# Patient Record
Sex: Female | Born: 1950 | Race: White | Hispanic: No | Marital: Married | Smoking: Never smoker
Health system: Southern US, Community
[De-identification: ages and names within clinical notes are randomized; demographics above are authoritative.]

## PROBLEM LIST (undated history)

## (undated) DIAGNOSIS — Z8719 Personal history of other diseases of the digestive system: Secondary | ICD-10-CM

## (undated) DIAGNOSIS — E669 Obesity, unspecified: Secondary | ICD-10-CM

## (undated) DIAGNOSIS — K5190 Ulcerative colitis, unspecified, without complications: Secondary | ICD-10-CM

## (undated) DIAGNOSIS — M1990 Unspecified osteoarthritis, unspecified site: Secondary | ICD-10-CM

## (undated) DIAGNOSIS — K219 Gastro-esophageal reflux disease without esophagitis: Secondary | ICD-10-CM

## (undated) DIAGNOSIS — D3500 Benign neoplasm of unspecified adrenal gland: Secondary | ICD-10-CM

## (undated) DIAGNOSIS — E785 Hyperlipidemia, unspecified: Secondary | ICD-10-CM

## (undated) DIAGNOSIS — K2970 Gastritis, unspecified, without bleeding: Secondary | ICD-10-CM

## (undated) DIAGNOSIS — M8580 Other specified disorders of bone density and structure, unspecified site: Secondary | ICD-10-CM

## (undated) DIAGNOSIS — Z8489 Family history of other specified conditions: Secondary | ICD-10-CM

## (undated) DIAGNOSIS — L570 Actinic keratosis: Secondary | ICD-10-CM

## (undated) DIAGNOSIS — I10 Essential (primary) hypertension: Secondary | ICD-10-CM

## (undated) DIAGNOSIS — R12 Heartburn: Secondary | ICD-10-CM

## (undated) DIAGNOSIS — R519 Headache, unspecified: Secondary | ICD-10-CM

## (undated) HISTORY — PX: ABDOMINAL HYSTERECTOMY: SHX81

## (undated) HISTORY — PX: CHOLECYSTECTOMY: SHX55

## (undated) HISTORY — DX: Obesity, unspecified: E66.9

## (undated) HISTORY — DX: Ulcerative colitis, unspecified, without complications: K51.90

## (undated) HISTORY — PX: GALLBLADDER SURGERY: SHX652

## (undated) HISTORY — PX: OTHER SURGICAL HISTORY: SHX169

## (undated) HISTORY — PX: LEG SURGERY: SHX1003

## (undated) HISTORY — PX: FRACTURE SURGERY: SHX138

## (undated) HISTORY — PX: EYE SURGERY: SHX253

## (undated) HISTORY — DX: Actinic keratosis: L57.0

## (undated) HISTORY — DX: Other specified disorders of bone density and structure, unspecified site: M85.80

## (undated) HISTORY — DX: Hyperlipidemia, unspecified: E78.5

## (undated) HISTORY — PX: COLONOSCOPY: SHX174

---

## 1998-05-31 ENCOUNTER — Other Ambulatory Visit: Admission: RE | Admit: 1998-05-31 | Discharge: 1998-05-31 | Payer: Self-pay | Admitting: Podiatry

## 2001-06-02 ENCOUNTER — Encounter: Payer: Self-pay | Admitting: Unknown Physician Specialty

## 2001-06-02 ENCOUNTER — Encounter: Admission: RE | Admit: 2001-06-02 | Discharge: 2001-06-02 | Payer: Self-pay | Admitting: Unknown Physician Specialty

## 2002-06-08 ENCOUNTER — Encounter: Admission: RE | Admit: 2002-06-08 | Discharge: 2002-06-08 | Payer: Self-pay | Admitting: Unknown Physician Specialty

## 2002-06-08 ENCOUNTER — Encounter: Payer: Self-pay | Admitting: Unknown Physician Specialty

## 2003-08-01 ENCOUNTER — Encounter: Admission: RE | Admit: 2003-08-01 | Discharge: 2003-08-01 | Payer: Self-pay | Admitting: Unknown Physician Specialty

## 2004-08-02 ENCOUNTER — Encounter: Admission: RE | Admit: 2004-08-02 | Discharge: 2004-08-02 | Payer: Self-pay | Admitting: Unknown Physician Specialty

## 2005-05-28 ENCOUNTER — Ambulatory Visit: Payer: Self-pay | Admitting: Unknown Physician Specialty

## 2005-07-08 ENCOUNTER — Ambulatory Visit: Payer: Self-pay | Admitting: Unknown Physician Specialty

## 2005-08-05 ENCOUNTER — Encounter: Admission: RE | Admit: 2005-08-05 | Discharge: 2005-08-05 | Payer: Self-pay | Admitting: Unknown Physician Specialty

## 2005-09-14 ENCOUNTER — Emergency Department: Payer: Self-pay | Admitting: Emergency Medicine

## 2006-07-01 ENCOUNTER — Emergency Department (HOSPITAL_COMMUNITY): Admission: EM | Admit: 2006-07-01 | Discharge: 2006-07-01 | Payer: Self-pay | Admitting: Family Medicine

## 2006-07-12 ENCOUNTER — Ambulatory Visit: Payer: Self-pay | Admitting: Unknown Physician Specialty

## 2006-08-08 ENCOUNTER — Encounter: Admission: RE | Admit: 2006-08-08 | Discharge: 2006-08-08 | Payer: Self-pay | Admitting: Unknown Physician Specialty

## 2006-08-18 ENCOUNTER — Encounter: Admission: RE | Admit: 2006-08-18 | Discharge: 2006-08-18 | Payer: Self-pay | Admitting: Unknown Physician Specialty

## 2006-09-15 ENCOUNTER — Ambulatory Visit: Payer: Self-pay | Admitting: Unknown Physician Specialty

## 2007-07-27 ENCOUNTER — Ambulatory Visit: Payer: Self-pay | Admitting: Obstetrics and Gynecology

## 2007-08-03 ENCOUNTER — Ambulatory Visit: Payer: Self-pay | Admitting: Obstetrics and Gynecology

## 2007-09-01 ENCOUNTER — Encounter: Admission: RE | Admit: 2007-09-01 | Discharge: 2007-09-01 | Payer: Self-pay | Admitting: Unknown Physician Specialty

## 2007-11-24 ENCOUNTER — Ambulatory Visit: Payer: Self-pay | Admitting: Unknown Physician Specialty

## 2007-12-29 ENCOUNTER — Ambulatory Visit: Payer: Self-pay | Admitting: Unknown Physician Specialty

## 2008-09-01 ENCOUNTER — Encounter: Admission: RE | Admit: 2008-09-01 | Discharge: 2008-09-01 | Payer: Self-pay | Admitting: Unknown Physician Specialty

## 2010-01-10 ENCOUNTER — Ambulatory Visit: Payer: Self-pay | Admitting: Unknown Physician Specialty

## 2010-03-18 ENCOUNTER — Encounter: Payer: Self-pay | Admitting: Unknown Physician Specialty

## 2010-11-26 ENCOUNTER — Other Ambulatory Visit: Payer: Self-pay | Admitting: Unknown Physician Specialty

## 2010-11-29 ENCOUNTER — Other Ambulatory Visit: Payer: Self-pay | Admitting: Unknown Physician Specialty

## 2011-04-23 ENCOUNTER — Ambulatory Visit: Payer: Self-pay

## 2011-11-05 ENCOUNTER — Other Ambulatory Visit: Payer: Self-pay

## 2012-07-01 ENCOUNTER — Ambulatory Visit: Payer: Self-pay | Admitting: Unknown Physician Specialty

## 2012-07-23 ENCOUNTER — Ambulatory Visit: Payer: Self-pay | Admitting: Unknown Physician Specialty

## 2012-08-06 ENCOUNTER — Ambulatory Visit: Payer: Self-pay | Admitting: Unknown Physician Specialty

## 2013-07-02 ENCOUNTER — Ambulatory Visit: Payer: Self-pay | Admitting: Physician Assistant

## 2013-10-07 ENCOUNTER — Ambulatory Visit: Payer: Self-pay

## 2013-10-07 ENCOUNTER — Encounter: Payer: Self-pay | Admitting: Podiatry

## 2013-10-07 ENCOUNTER — Ambulatory Visit (INDEPENDENT_AMBULATORY_CARE_PROVIDER_SITE_OTHER): Payer: Medicare Other | Admitting: Podiatry

## 2013-10-07 VITALS — BP 133/85 | HR 68 | Resp 16 | Ht 63.0 in | Wt 193.0 lb

## 2013-10-07 DIAGNOSIS — K5190 Ulcerative colitis, unspecified, without complications: Secondary | ICD-10-CM | POA: Insufficient documentation

## 2013-10-07 DIAGNOSIS — M8580 Other specified disorders of bone density and structure, unspecified site: Secondary | ICD-10-CM | POA: Insufficient documentation

## 2013-10-07 DIAGNOSIS — M722 Plantar fascial fibromatosis: Secondary | ICD-10-CM

## 2013-10-07 DIAGNOSIS — E785 Hyperlipidemia, unspecified: Secondary | ICD-10-CM | POA: Insufficient documentation

## 2013-10-07 DIAGNOSIS — D3500 Benign neoplasm of unspecified adrenal gland: Secondary | ICD-10-CM | POA: Insufficient documentation

## 2013-10-07 DIAGNOSIS — E669 Obesity, unspecified: Secondary | ICD-10-CM | POA: Insufficient documentation

## 2013-10-07 DIAGNOSIS — K2970 Gastritis, unspecified, without bleeding: Secondary | ICD-10-CM | POA: Insufficient documentation

## 2013-10-07 DIAGNOSIS — E7800 Pure hypercholesterolemia, unspecified: Secondary | ICD-10-CM | POA: Insufficient documentation

## 2013-10-07 MED ORDER — METHYLPREDNISOLONE (PAK) 4 MG PO TABS: ORAL_TABLET | ORAL | Status: DC

## 2013-10-07 NOTE — Progress Notes (Signed)
   Subjective:    Patient ID: Dineen Kid, female    DOB: June 19, 1950, 63 y.o.   MRN: [id number]  HPI Comments: Easter Sunday the heel started to hurt. i drove home barefooted. During that week my heel started hurting. It feels like needles shooting through it. I  Have tried to wear orthotics, compression stocking it stays swollen. It is not getting any better, used frozen water bottle and heat and it made it feel bad. Tried to use some exercises .  Foot Pain      Review of Systems  Musculoskeletal: Positive for back pain.       Muscle pain  Joint pain   Hematological: Bruises/bleeds easily.  All other systems reviewed and are negative.      Objective:   Physical Exam: I have reviewed her past medical history medications allergies surgeries social history and review of systems. Pulses are palpable bilateral. Neurologic sensorium is intact per Semmes-Weinstein monofilament. Deep tendon reflexes are intact bilateral muscle strength is 5 over 5 dorsiflexors plantar flexors inverters everters all intrinsic musculature is intact. Orthopedic evaluation does demonstrate pain on palpation medial continued tubercle of the right heel. Radiographic evaluation does demonstrate soft tissue increase in density at the plantar fascial calcaneal insertion site of the right heel. Cutaneous evaluation demonstrates supple well hydrated cutis no erythema edema saline is drainage or odor.  Assessment: Plantar fasciitis right.  Plan: Discussed etiology pathology conservative versus surgical therapies. Secondary to her colitis we'll he prescribed a Medrol Dosepak. Injected her right heel with Kenalog and local anesthetic. Put her in a plantar fascial brace and dispensed a night splint. Discussed appropriate shoe gear stretching exercises ice therapy and shoe gear modifications. I will followup with her in one month.        Assessment & Plan:

## 2013-10-07 NOTE — Patient Instructions (Signed)

## 2013-11-04 ENCOUNTER — Ambulatory Visit (INDEPENDENT_AMBULATORY_CARE_PROVIDER_SITE_OTHER): Payer: Medicare Other | Admitting: Podiatry

## 2013-11-04 ENCOUNTER — Encounter: Payer: Self-pay | Admitting: Podiatry

## 2013-11-04 VITALS — BP 122/72 | HR 74 | Resp 16

## 2013-11-04 DIAGNOSIS — M722 Plantar fascial fibromatosis: Secondary | ICD-10-CM

## 2013-11-04 NOTE — Progress Notes (Signed)
She presents today for followup of her plantar fasciitis. She states that she's approximately 95% improved after purchasing a new pair of new balance tennis shoes. She states that she was unable to wear the night splint but really enjoys the plantar fascial brace. She continues medication and other conservative therapies.  Objective: No pain on palpation medial calcaneal tubercle of the left heel. Pulses remain palpable no calf pain.  Assessment: Well-healing plantar fasciitis left.  Plan: Continue all conservative therapies and tissues 100% improved then continue that for one month. Followup with her on an as-needed basis.

## 2014-06-21 ENCOUNTER — Other Ambulatory Visit: Payer: Self-pay | Admitting: Physician Assistant

## 2014-06-21 DIAGNOSIS — Z1231 Encounter for screening mammogram for malignant neoplasm of breast: Secondary | ICD-10-CM

## 2014-07-06 ENCOUNTER — Ambulatory Visit: Payer: Self-pay

## 2014-07-11 ENCOUNTER — Emergency Department: Payer: PPO

## 2014-07-11 ENCOUNTER — Emergency Department
Admission: EM | Admit: 2014-07-11 | Discharge: 2014-07-11 | Disposition: A | Discharge status: Home / Self Care | Payer: PPO | Attending: Emergency Medicine | Admitting: Emergency Medicine

## 2014-07-11 ENCOUNTER — Encounter: Payer: Self-pay | Admitting: Occupational Medicine

## 2014-07-11 DIAGNOSIS — Z91040 Latex allergy status: Secondary | ICD-10-CM | POA: Insufficient documentation

## 2014-07-11 DIAGNOSIS — Y998 Other external cause status: Secondary | ICD-10-CM | POA: Insufficient documentation

## 2014-07-11 DIAGNOSIS — S99911A Unspecified injury of right ankle, initial encounter: Secondary | ICD-10-CM | POA: Diagnosis present

## 2014-07-11 DIAGNOSIS — W1830XA Fall on same level, unspecified, initial encounter: Secondary | ICD-10-CM | POA: Diagnosis not present

## 2014-07-11 DIAGNOSIS — Y9289 Other specified places as the place of occurrence of the external cause: Secondary | ICD-10-CM | POA: Insufficient documentation

## 2014-07-11 DIAGNOSIS — Y9389 Activity, other specified: Secondary | ICD-10-CM | POA: Diagnosis not present

## 2014-07-11 DIAGNOSIS — Z7983 Long term (current) use of bisphosphonates: Secondary | ICD-10-CM | POA: Insufficient documentation

## 2014-07-11 DIAGNOSIS — S93401A Sprain of unspecified ligament of right ankle, initial encounter: Secondary | ICD-10-CM | POA: Diagnosis not present

## 2014-07-11 MED ORDER — TRAMADOL HCL 50 MG PO TABS
ORAL_TABLET | ORAL | Status: AC
  Administered 2014-07-11: 100 mg via ORAL
  Filled 2014-07-11: qty 2

## 2014-07-11 MED ORDER — TRAMADOL HCL 50 MG PO TABS
50.0000 mg | ORAL_TABLET | Freq: Four times a day (QID) | ORAL | Status: AC | PRN
Start: 2014-07-11 — End: 2015-07-11

## 2014-07-11 MED ORDER — TRAMADOL HCL 50 MG PO TABS
100.0000 mg | ORAL_TABLET | Freq: Once | ORAL | Status: AC
  Administered 2014-07-11: 100 mg via ORAL

## 2014-07-11 NOTE — ED Provider Notes (Signed)
[facility] Emergency Department Provider Note  Time seen: 7:56 PM  I have reviewed the triage vital signs and the nursing notes.   HISTORY  Chief Complaint Foot Pain    HPI Jamie Reid is a 64 y.o. female with a past medical history of ulcerative colitis, hyperlipidemia, reflux who presents the emergency department with right ankle pain. According to the patient she tripped and rolled her ankle. Patient has been able to walk on it but states increasing pain and swelling to she came to the ER for evaluation. Denies any other injuries, denies hitting her head or loss of consciousness. Patient describes the pain as dull and constant moderate in severity and worse with ambulation or trying to bear weight.     Past Medical History  Diagnosis Date  . Ulcerative colitis   . Hyperlipidemia   . Osteopenia   . Adiposity   . Colitis gravis     Patient Active Problem List   Diagnosis Date Noted  . Adrenal adenoma 10/07/2013  . Gastric catarrh 10/07/2013  . Hypercholesteremia 10/07/2013  . HLD (hyperlipidemia) 10/07/2013  . Adiposity 10/07/2013  . Osteopenia 10/07/2013  . Colitis gravis 10/07/2013    Past Surgical History  Procedure Laterality Date  . Abdominal hysterectomy    . Gallbladder surgery      Current Outpatient Rx  Name  Route  Sig  Dispense  Refill  . alendronate (FOSAMAX) 35 MG tablet   Oral   Take by mouth.         . Cholecalciferol (VITAMIN D3) 1000 UNITS CAPS   Oral   Take by mouth.         . Mesalamine (DELZICOL) 400 MG CPDR DR capsule      Take three tablets by mouth three times a day         . pantoprazole (PROTONIX) 40 MG tablet   Oral   Take by mouth.           Allergies Calcium; Codeine; Latex; Nsaids; and Sulfa antibiotics  Family History  Problem Relation Age of Onset  . Heart attack Mother   . Heart attack Father   . Cancer Sister     Social History History  Substance Use Topics  . Smoking  status: Never Smoker   . Smokeless tobacco: Never Used  . Alcohol Use: Not on file    Review of Systems Constitutional: Negative for LOC Cardiovascular: Negative for chest pain. Gastrointestinal: Negative for abdominal pain Musculoskeletal: Negative for back pain. Positive for right ankle pain  10-point ROS otherwise negative.  ____________________________________________   PHYSICAL EXAM:  VITAL SIGNS: ED Triage Vitals  Enc Vitals Group     BP 07/11/14 1947 147/78 mmHg     Pulse Rate 07/11/14 1947 68     Resp --      Temp 07/11/14 1947 97.9 F (36.6 C)     Temp Source 07/11/14 1947 Oral     SpO2 07/11/14 1947 98 %     Weight --      Height --      Head Cir --      Peak Flow --      Pain Score 07/11/14 1947 8     Pain Loc --      Pain Edu? --      Excl. in [place]? --     Constitutional: Alert and oriented. Well appearing and in no distress. Cardiovascular: Normal rate, regular rhythm Respiratory: Normal respiratory effort without tachypnea  nor retractions. Breath sounds are clear  Gastrointestinal: Soft and nontender. Musculoskeletal: Moderate tenderness palpation of the right ankle and proximal right foot mild swelling, no ecchymosis noted. Good cap refill in all digits distally. Normal sensation. Good range of motion but with pain. Neurologic:  Normal speech and language. No gross focal neurologic deficits  Skin:  Skin is warm, dry and intact.  Psychiatric: Mood and affect are normal. Speech and behavior are normal  ____________________________________________   RADIOLOGY  Ankle and foot x-rays are negative.  ____________________________________________   INITIAL IMPRESSION / ASSESSMENT AND PLAN / ED COURSE  Pertinent labs & imaging results that were available during my care of the patient were reviewed by me and considered in my medical decision making (see chart for details).  Right ankle pain status post roll injury. We'll check an x-ray. No other  injuries identified on exam.  ----------------------------------------- 9:31 PM on 07/11/2014 -----------------------------------------  X-rays are negative, most consistent with significant ankle sprain. We'll place in an air splint, patient has crutches with her and will place on Ultram with orthopedic follow-up if not better in one week.  ____________________________________________   FINAL CLINICAL IMPRESSION(S) / ED DIAGNOSES  Right ankle sprain   Harvest Dark, MD 07/11/14 2131

## 2014-07-11 NOTE — ED Notes (Signed)
Pt was in the garden got tangled up in the jupitor and fell heard pop to right foot. Right top of foot is painful to touch and movement swelling noted. Pain 8/10. Pt came in barely putting weight to right foot with crutches. Pt denies hitting head or having LOC. Pt has a skin tear to left hand covered with Band-Aid bleeding under control.

## 2014-07-11 NOTE — Discharge Instructions (Signed)

## 2014-07-13 ENCOUNTER — Other Ambulatory Visit: Payer: Self-pay | Admitting: Physician Assistant

## 2014-07-13 ENCOUNTER — Ambulatory Visit
Admission: RE | Admit: 2014-07-13 | Discharge: 2014-07-13 | Disposition: A | Discharge status: Home / Self Care | Payer: PPO | Source: Ambulatory Visit | Attending: Physician Assistant | Admitting: Physician Assistant

## 2014-07-13 DIAGNOSIS — Z1231 Encounter for screening mammogram for malignant neoplasm of breast: Secondary | ICD-10-CM | POA: Diagnosis not present

## 2014-08-31 ENCOUNTER — Other Ambulatory Visit: Payer: Self-pay | Admitting: Physician Assistant

## 2014-08-31 DIAGNOSIS — D3502 Benign neoplasm of left adrenal gland: Secondary | ICD-10-CM

## 2014-09-05 ENCOUNTER — Ambulatory Visit
Admission: RE | Admit: 2014-09-05 | Discharge: 2014-09-05 | Disposition: A | Discharge status: Home / Self Care | Payer: PPO | Source: Ambulatory Visit | Attending: Physician Assistant | Admitting: Physician Assistant

## 2014-09-05 DIAGNOSIS — E785 Hyperlipidemia, unspecified: Secondary | ICD-10-CM | POA: Diagnosis not present

## 2014-09-05 DIAGNOSIS — K838 Other specified diseases of biliary tract: Secondary | ICD-10-CM | POA: Diagnosis not present

## 2014-09-05 DIAGNOSIS — Z90710 Acquired absence of both cervix and uterus: Secondary | ICD-10-CM | POA: Diagnosis not present

## 2014-09-05 DIAGNOSIS — I700 Atherosclerosis of aorta: Secondary | ICD-10-CM | POA: Insufficient documentation

## 2014-09-05 DIAGNOSIS — K449 Diaphragmatic hernia without obstruction or gangrene: Secondary | ICD-10-CM | POA: Diagnosis not present

## 2014-09-05 DIAGNOSIS — Z9049 Acquired absence of other specified parts of digestive tract: Secondary | ICD-10-CM | POA: Insufficient documentation

## 2014-09-05 DIAGNOSIS — D3502 Benign neoplasm of left adrenal gland: Secondary | ICD-10-CM

## 2014-09-30 ENCOUNTER — Encounter: Payer: Self-pay | Admitting: Emergency Medicine

## 2014-09-30 ENCOUNTER — Emergency Department
Admission: EM | Admit: 2014-09-30 | Discharge: 2014-09-30 | Disposition: A | Discharge status: Home / Self Care | Payer: PPO | Attending: Emergency Medicine | Admitting: Emergency Medicine

## 2014-09-30 ENCOUNTER — Emergency Department: Payer: PPO

## 2014-09-30 DIAGNOSIS — Z792 Long term (current) use of antibiotics: Secondary | ICD-10-CM | POA: Diagnosis not present

## 2014-09-30 DIAGNOSIS — R1013 Epigastric pain: Secondary | ICD-10-CM | POA: Insufficient documentation

## 2014-09-30 DIAGNOSIS — F419 Anxiety disorder, unspecified: Secondary | ICD-10-CM | POA: Insufficient documentation

## 2014-09-30 DIAGNOSIS — M542 Cervicalgia: Secondary | ICD-10-CM | POA: Diagnosis not present

## 2014-09-30 DIAGNOSIS — Z91040 Latex allergy status: Secondary | ICD-10-CM | POA: Diagnosis not present

## 2014-09-30 DIAGNOSIS — Z79899 Other long term (current) drug therapy: Secondary | ICD-10-CM | POA: Insufficient documentation

## 2014-09-30 DIAGNOSIS — I10 Essential (primary) hypertension: Secondary | ICD-10-CM | POA: Diagnosis not present

## 2014-09-30 DIAGNOSIS — R61 Generalized hyperhidrosis: Secondary | ICD-10-CM | POA: Diagnosis not present

## 2014-09-30 DIAGNOSIS — R109 Unspecified abdominal pain: Secondary | ICD-10-CM | POA: Diagnosis present

## 2014-09-30 HISTORY — DX: Essential (primary) hypertension: I10

## 2014-09-30 LAB — CBC WITH DIFFERENTIAL/PLATELET
BASOS ABS: 0.1 10*3/uL (ref 0–0.1)
BASOS PCT: 1 %
EOS ABS: 0.1 10*3/uL (ref 0–0.7)
Eosinophils Relative: 1 %
HCT: 43.1 % (ref 35.0–47.0)
Hemoglobin: 14.4 g/dL (ref 12.0–16.0)
LYMPHS ABS: 1.3 10*3/uL (ref 1.0–3.6)
Lymphocytes Relative: 12 %
MCH: 29 pg (ref 26.0–34.0)
MCHC: 33.3 g/dL (ref 32.0–36.0)
MCV: 87.1 fL (ref 80.0–100.0)
MONO ABS: 1.1 10*3/uL — AB (ref 0.2–0.9)
MONOS PCT: 10 %
NEUTROS ABS: 8.3 10*3/uL — AB (ref 1.4–6.5)
NEUTROS PCT: 76 %
Platelets: 220 10*3/uL (ref 150–440)
RBC: 4.94 MIL/uL (ref 3.80–5.20)
RDW: 13.2 % (ref 11.5–14.5)
WBC: 10.8 10*3/uL (ref 3.6–11.0)

## 2014-09-30 LAB — COMPREHENSIVE METABOLIC PANEL
ALT: 19 U/L (ref 14–54)
ANION GAP: 12 (ref 5–15)
AST: 30 U/L (ref 15–41)
Albumin: 3.7 g/dL (ref 3.5–5.0)
Alkaline Phosphatase: 81 U/L (ref 38–126)
BUN: 11 mg/dL (ref 6–20)
CALCIUM: 8.4 mg/dL — AB (ref 8.9–10.3)
CHLORIDE: 94 mmol/L — AB (ref 101–111)
CO2: 24 mmol/L (ref 22–32)
CREATININE: 0.59 mg/dL (ref 0.44–1.00)
GFR calc Af Amer: >60 mL/min (ref >60)
GFR calc non Af Amer: >60 mL/min (ref >60)
Glucose, Bld: 121 mg/dL — ABNORMAL HIGH (ref 65–99)
POTASSIUM: 3.4 mmol/L — AB (ref 3.5–5.1)
Sodium: 130 mmol/L — ABNORMAL LOW (ref 135–145)
TOTAL PROTEIN: 7.1 g/dL (ref 6.5–8.1)
Total Bilirubin: 0.7 mg/dL (ref 0.3–1.2)

## 2014-09-30 LAB — TROPONIN I

## 2014-09-30 LAB — LIPASE, BLOOD: LIPASE: 167 U/L — AB (ref 22–51)

## 2014-09-30 MED ORDER — HYDROMORPHONE HCL 1 MG/ML IJ SOLN
0.5000 mg | Freq: Once | INTRAMUSCULAR | Status: AC
  Administered 2014-09-30: 0.5 mg via INTRAVENOUS
  Filled 2014-09-30: qty 1

## 2014-09-30 MED ORDER — IOHEXOL 300 MG/ML  SOLN
75.0000 mL | Freq: Once | INTRAMUSCULAR | Status: AC | PRN
Start: 2014-09-30 — End: 2014-09-30
  Administered 2014-09-30: 75 mL via INTRAVENOUS

## 2014-09-30 MED ORDER — PANTOPRAZOLE SODIUM 40 MG PO TBEC
40.0000 mg | DELAYED_RELEASE_TABLET | Freq: Two times a day (BID) | ORAL | Status: AC
Start: 2014-09-30 — End: 2015-09-30

## 2014-09-30 MED ORDER — ONDANSETRON HCL 4 MG/2ML IJ SOLN
4.0000 mg | Freq: Once | INTRAMUSCULAR | Status: AC
  Administered 2014-09-30: 4 mg via INTRAVENOUS
  Filled 2014-09-30: qty 2

## 2014-09-30 MED ORDER — GI COCKTAIL ~~LOC~~
30.0000 mL | Freq: Once | ORAL | Status: AC
  Administered 2014-09-30: 30 mL via ORAL

## 2014-09-30 MED ORDER — GI COCKTAIL ~~LOC~~
30.0000 mL | Freq: Once | ORAL | Status: DC
Start: 2014-09-30 — End: 2014-09-30
  Filled 2014-09-30: qty 30

## 2014-09-30 NOTE — Discharge Instructions (Signed)
Abdominal Pain Many things can cause abdominal pain. Usually, abdominal pain is not caused by a disease and will improve without treatment. It can often be observed and treated at home. Your health care provider will do a physical exam and possibly order blood tests and X-rays to help determine the seriousness of your pain. However, in many cases, more time must pass before a clear cause of the pain can be found. Before that point, your health care provider may not know if you need more testing or further treatment. HOME CARE INSTRUCTIONS  Monitor your abdominal pain for any changes. The following actions may help to alleviate any discomfort you are experiencing:  Only take over-the-counter or prescription medicines as directed by your health care provider.  Do not take laxatives unless directed to do so by your health care provider.  Try a clear liquid diet (broth, tea, or water) as directed by your health care provider. Slowly move to a bland diet as tolerated. SEEK MEDICAL CARE IF:  You have unexplained abdominal pain.  You have abdominal pain associated with nausea or diarrhea.  You have pain when you urinate or have a bowel movement.  You experience abdominal pain that wakes you in the night.  You have abdominal pain that is worsened or improved by eating food.  You have abdominal pain that is worsened with eating fatty foods.  You have a fever. SEEK IMMEDIATE MEDICAL CARE IF:   Your pain does not go away within 2 hours.  You keep throwing up (vomiting).  Your pain is felt only in portions of the abdomen, such as the right side or the left lower portion of the abdomen.  You pass bloody or black tarry stools. MAKE SURE YOU:  Understand these instructions.   Will watch your condition.   Will get help right away if you are not doing well or get worse.  Document Released: 11/21/2004 Document Revised: 02/16/2013 Document Reviewed: 10/21/2012 [facility] Patient Information  2015 [place], [place]. This information is not intended to replace advice given to you by your health care provider. Make sure you discuss any questions you have with your health care provider.   Return immediately if condition worsens, he can take over-the-counter antacid medications. These contact her primary physician for further outpatient workup concerning both to epigastric pain and her neck discomfort.

## 2014-09-30 NOTE — ED Notes (Signed)
Brought in via ems for epigastric/chest pain this am..states pain started after taking percocet and eating a triscit

## 2014-09-30 NOTE — ED Notes (Signed)
States her epigastric pain returned after trying to eat a mint.. MD ware  New orders received. Po meds given for pain.family remins at bedside

## 2014-09-30 NOTE — ED Notes (Signed)
States she developed epigastric pain after taking po meds and eating a cracker this am. Was given 2 ntg' s by ems  States pain is better on arrival.. States she was placed on percocet for pain to neck ..states she is still having pain to neck area

## 2014-09-30 NOTE — ED Notes (Signed)
Back from x-ray.. Iv pain meds given  Side rail up and effects explained  Family at bedside

## 2014-09-30 NOTE — ED Provider Notes (Signed)
[facility] Emergency Department Provider Note  ____________________________________________  Time seen: 7 AM  I have reviewed the triage vital signs and the nursing notes.   HISTORY  Chief Complaint Abdominal Pain    HPI Jamie Reid is a 64 y.o. female who presents with acute epigastric abdominal pain that started this morning. Patient does describe some nausea, positive for vomiting. She vomited 1 prior to arrival symptomatic relief. At the time of initial evaluation she states that she is feeling improved. She did have some mild diaphoresis. She denies any chest, arm, or jaw pain. She states she took half a tablet of Percocet yesterday which was prescribed for Cleocin to be some musculoskeletal back pain. She states she went to a walk-in clinic and had some pain in the center of her neck mainly with palpation and neck fraction was diagnosed with musculoskeletal neck pain. And states she took a full tablet of the Percocet and shortly afterwards is when she started having her symptoms. She denies any fever at home she denies any lower abdominal pain back or flank discomfort. She denies any shortness of breath and productive cough or wheezing. Her neck pain seems to be localized posteriorly in the middle is where she presses and states it does occasionally hurt with movement but denies any neuropraxia. States a significant headache or focal weakness    Past Medical History  Diagnosis Date  . Ulcerative colitis   . Hyperlipidemia   . Osteopenia   . Adiposity   . Colitis gravis   . Hypertension    Esophageal reflux disease and has been on long-term proton pump inhibitor therapy Patient Active Problem List   Diagnosis Date Noted  . Adrenal adenoma 10/07/2013  . Gastric catarrh 10/07/2013  . Hypercholesteremia 10/07/2013  . HLD (hyperlipidemia) 10/07/2013  . Adiposity 10/07/2013  . Osteopenia 10/07/2013  . Colitis gravis 10/07/2013    Past Surgical  History  Procedure Laterality Date  . Abdominal hysterectomy    . Gallbladder surgery      Current Outpatient Rx  Name  Route  Sig  Dispense  Refill  . alendronate (FOSAMAX) 35 MG tablet   Oral   Take 35 mg by mouth every 7 (seven) days. Take with a full glass of water on an empty stomach.         . ciprofloxacin (CIPRO) 250 MG tablet   Oral   Take 250 mg by mouth 2 (two) times daily.         Marland Kitchen losartan (COZAAR) 25 MG tablet   Oral   Take 25 mg by mouth daily.         . ondansetron (ZOFRAN) 4 MG tablet   Oral   Take 4 mg by mouth every 8 (eight) hours as needed for nausea or vomiting.         Marland Kitchen oxyCODONE-acetaminophen (PERCOCET/ROXICET) 5-325 MG per tablet   Oral   Take 1 tablet by mouth every 6 (six) hours as needed for severe pain.         . pantoprazole (PROTONIX) 40 MG tablet   Oral   Take 1 tablet (40 mg total) by mouth 2 (two) times daily.   60 tablet   1   . traMADol (ULTRAM) 50 MG tablet   Oral   Take 1 tablet (50 mg total) by mouth every 6 (six) hours as needed.   20 tablet   0     Allergies Calcium; Codeine; Latex; Nsaids; Sulfa antibiotics; and Tramadol  Family History  Problem Relation Age of Onset  . Heart attack Mother   . Heart attack Father   . Colon cancer Sister     Social History History  Substance Use Topics  . Smoking status: Never Smoker   . Smokeless tobacco: Never Used  . Alcohol Use: No    Review of Systems  Constitutional: Negative for fever. Eyes: Negative for visual changes. ENT: Negative for sore throat Cardiovascular: Negative for chest pain. Respiratory: Negative for shortness of breath. Gastrointestinal: Negative for abdominal pain, vomiting and diarrhea. Genitourinary: Negative for dysuria. Musculoskeletal: Negative for back pain. Skin: Negative for rash. Neurological: Negative for headaches or focal weakness Psychiatric: Anxious    ____________________________________________   PHYSICAL  EXAM:  VITAL SIGNS: ED Triage Vitals  Enc Vitals Group     BP 09/30/14 0835 120/72 mmHg     Pulse Rate 09/30/14 0835 62     Resp 09/30/14 0835 18     Temp 09/30/14 0835 98 F (36.7 C)     Temp Source 09/30/14 0835 Oral     SpO2 09/30/14 0835 96 %     Weight 09/30/14 0835 187 lb (84.823 kg)     Height 09/30/14 0835 5' 3"  (1.6 m)     Head Cir --      Peak Flow --      Pain Score 09/30/14 0836 0     Pain Loc --      Pain Edu? --      Excl. in [place]? --     Constitutional: Alert and oriented. Well appearing and in no distress. Eyes: Conjunctivae are normal.  ENT   Head: Normocephalic and atraumatic.   Mouth/Throat: Mucous membranes are moist. Cardiovascular: Normal rate, regular rhythm. Normal and symmetric distal pulses are present in all extremities. No murmurs, rubs, or gallops. Respiratory: Normal respiratory effort without tachypnea nor retractions. Breath sounds are clear and equal bilaterally.  Gastrointestinal: Mild reproducible pain across the epigastric region No distention. There is no CVA tenderness. Genitourinary: deferred Musculoskeletal: Patient has mild reproducible pain mid cervical spine region without any crepitus or step-off noted. No posterior contusions, abrasions or rashes. Neurologic:  Normal speech and language. No gross focal neurologic deficits are appreciated. Skin:  Skin is warm, dry and intact. No rash noted. Psychiatric: Mood and affect are normal. Patient exhibits appropriate insight and judgment.  ____________________________________________    LABS (pertinent positives/negatives)  Labs Reviewed  CBC WITH DIFFERENTIAL/PLATELET - Abnormal; Notable for the following:    Neutro Abs 8.3 (*)    Monocytes Absolute 1.1 (*)    All other components within normal limits  COMPREHENSIVE METABOLIC PANEL - Abnormal; Notable for the following:    Sodium 130 (*)    Potassium 3.4 (*)    Chloride 94 (*)    Glucose, Bld 121 (*)    Calcium 8.4 (*)     All other components within normal limits  LIPASE, BLOOD - Abnormal; Notable for the following:    Lipase 167 (*)    All other components within normal limits  TROPONIN I   Lipase is slightly elevated that I did not feel at reach the diagnostic criteria for pancreatitis ____________________________________________   EKG  ED ECG REPORT I, Daymon Larsen, the attending physician, personally viewed and interpreted this ECG.  Date: 09/30/2014 EKG Time: 828 Rate: 16 Rhythm: normal sinus rhythm QRS Axis: normal Intervals: normal ST/T Wave abnormalities: normal Conduction Disutrbances: none Narrative Interpretation: unremarkable   ____________________________________________    RADIOLOGY  I have personally reviewed any xrays that were ordered on this patient:  Patient's initial chest x-ray which shows some upper lobe pulmonary nodules according to the radiologist and was requested that a chest CT for clearance. Chest CT showed no significant findings and some mild dilation of the distal esophagus and a small hiatal hernia. __X-rays of the cervical spine showed no lytic lesions __________________________________________   PROCEDURES  None ____________________________________________   INITIAL IMPRESSION / ASSESSMENT AND PLAN / ED COURSE  Pertinent labs & imaging results that were available during my care of the patient were reviewed by me and considered in my medical decision making (see chart for details).  ED course: Patient's stay here was uneventful. She received a GI cocktail with some symptomatic improvement and was investigated for the possibility of acute coronary syndrome. Given a normal EKG and negative troponin I did not feel she required any further testing especially given her clinical presentation. Patient had repeat of symptoms when she tried to swallow but does tablet here in emergency department and was given a GI cocktail with relief. He was given some IV pain  medications for neck pain which does appear to be musculoskeletal and certainly has no signs of neurologic sequelae. She was advised continue with the Percocet since it often relieved however continue with the half tablet need a full meal with the medication as opposed to just a cracker that she  the took the medication  with this morning  ____________________________________________   FINAL CLINICAL IMPRESSION(S) / ED DIAGNOSES  Final diagnoses:  Epigastric abdominal pain   musculoskeletal neck pain   Daymon Larsen, MD 09/30/14 [id number]

## 2015-04-11 DIAGNOSIS — K5190 Ulcerative colitis, unspecified, without complications: Secondary | ICD-10-CM | POA: Diagnosis not present

## 2015-04-24 DIAGNOSIS — L439 Lichen planus, unspecified: Secondary | ICD-10-CM | POA: Diagnosis not present

## 2015-04-24 DIAGNOSIS — L570 Actinic keratosis: Secondary | ICD-10-CM | POA: Diagnosis not present

## 2015-04-24 DIAGNOSIS — D229 Melanocytic nevi, unspecified: Secondary | ICD-10-CM | POA: Diagnosis not present

## 2015-04-24 DIAGNOSIS — L821 Other seborrheic keratosis: Secondary | ICD-10-CM | POA: Diagnosis not present

## 2015-04-24 DIAGNOSIS — D492 Neoplasm of unspecified behavior of bone, soft tissue, and skin: Secondary | ICD-10-CM | POA: Diagnosis not present

## 2015-05-01 DIAGNOSIS — M25562 Pain in left knee: Secondary | ICD-10-CM | POA: Diagnosis not present

## 2015-05-01 DIAGNOSIS — M25561 Pain in right knee: Secondary | ICD-10-CM | POA: Diagnosis not present

## 2015-05-01 DIAGNOSIS — M170 Bilateral primary osteoarthritis of knee: Secondary | ICD-10-CM | POA: Diagnosis not present

## 2015-05-09 DIAGNOSIS — M25562 Pain in left knee: Secondary | ICD-10-CM | POA: Diagnosis not present

## 2015-05-09 DIAGNOSIS — R2689 Other abnormalities of gait and mobility: Secondary | ICD-10-CM | POA: Diagnosis not present

## 2015-05-09 DIAGNOSIS — M791 Myalgia: Secondary | ICD-10-CM | POA: Diagnosis not present

## 2015-05-09 DIAGNOSIS — M1711 Unilateral primary osteoarthritis, right knee: Secondary | ICD-10-CM | POA: Diagnosis not present

## 2015-05-09 DIAGNOSIS — M1712 Unilateral primary osteoarthritis, left knee: Secondary | ICD-10-CM | POA: Diagnosis not present

## 2015-05-16 DIAGNOSIS — M1712 Unilateral primary osteoarthritis, left knee: Secondary | ICD-10-CM | POA: Diagnosis not present

## 2015-05-16 DIAGNOSIS — M25562 Pain in left knee: Secondary | ICD-10-CM | POA: Diagnosis not present

## 2015-05-23 DIAGNOSIS — M1712 Unilateral primary osteoarthritis, left knee: Secondary | ICD-10-CM | POA: Diagnosis not present

## 2015-05-23 DIAGNOSIS — M25562 Pain in left knee: Secondary | ICD-10-CM | POA: Diagnosis not present

## 2015-05-24 DIAGNOSIS — M531 Cervicobrachial syndrome: Secondary | ICD-10-CM | POA: Diagnosis not present

## 2015-05-24 DIAGNOSIS — M9901 Segmental and somatic dysfunction of cervical region: Secondary | ICD-10-CM | POA: Diagnosis not present

## 2015-05-24 DIAGNOSIS — M5441 Lumbago with sciatica, right side: Secondary | ICD-10-CM | POA: Diagnosis not present

## 2015-05-24 DIAGNOSIS — M9903 Segmental and somatic dysfunction of lumbar region: Secondary | ICD-10-CM | POA: Diagnosis not present

## 2015-05-30 ENCOUNTER — Other Ambulatory Visit: Payer: Self-pay | Admitting: Physician Assistant

## 2015-05-30 DIAGNOSIS — M25562 Pain in left knee: Secondary | ICD-10-CM | POA: Diagnosis not present

## 2015-05-30 DIAGNOSIS — Z1231 Encounter for screening mammogram for malignant neoplasm of breast: Secondary | ICD-10-CM

## 2015-05-30 DIAGNOSIS — M1712 Unilateral primary osteoarthritis, left knee: Secondary | ICD-10-CM | POA: Diagnosis not present

## 2015-06-06 DIAGNOSIS — M1712 Unilateral primary osteoarthritis, left knee: Secondary | ICD-10-CM | POA: Diagnosis not present

## 2015-06-06 DIAGNOSIS — M25562 Pain in left knee: Secondary | ICD-10-CM | POA: Diagnosis not present

## 2015-06-20 DIAGNOSIS — E782 Mixed hyperlipidemia: Secondary | ICD-10-CM | POA: Diagnosis not present

## 2015-06-20 DIAGNOSIS — I10 Essential (primary) hypertension: Secondary | ICD-10-CM | POA: Diagnosis not present

## 2015-06-20 DIAGNOSIS — M8580 Other specified disorders of bone density and structure, unspecified site: Secondary | ICD-10-CM | POA: Diagnosis not present

## 2015-06-27 DIAGNOSIS — M8580 Other specified disorders of bone density and structure, unspecified site: Secondary | ICD-10-CM | POA: Diagnosis not present

## 2015-06-27 DIAGNOSIS — Z0000 Encounter for general adult medical examination without abnormal findings: Secondary | ICD-10-CM | POA: Diagnosis not present

## 2015-06-27 DIAGNOSIS — K5190 Ulcerative colitis, unspecified, without complications: Secondary | ICD-10-CM | POA: Diagnosis not present

## 2015-06-27 DIAGNOSIS — E782 Mixed hyperlipidemia: Secondary | ICD-10-CM | POA: Diagnosis not present

## 2015-06-27 DIAGNOSIS — D3502 Benign neoplasm of left adrenal gland: Secondary | ICD-10-CM | POA: Diagnosis not present

## 2015-06-27 DIAGNOSIS — M533 Sacrococcygeal disorders, not elsewhere classified: Secondary | ICD-10-CM | POA: Diagnosis not present

## 2015-07-08 DIAGNOSIS — J040 Acute laryngitis: Secondary | ICD-10-CM | POA: Diagnosis not present

## 2015-07-14 ENCOUNTER — Other Ambulatory Visit: Payer: Self-pay | Admitting: Physician Assistant

## 2015-07-14 ENCOUNTER — Ambulatory Visit
Admission: RE | Admit: 2015-07-14 | Discharge: 2015-07-14 | Disposition: A | Discharge status: Home / Self Care | Payer: PPO | Source: Ambulatory Visit | Attending: Physician Assistant | Admitting: Physician Assistant

## 2015-07-14 DIAGNOSIS — Z1231 Encounter for screening mammogram for malignant neoplasm of breast: Secondary | ICD-10-CM | POA: Diagnosis not present

## 2015-07-21 DIAGNOSIS — M8580 Other specified disorders of bone density and structure, unspecified site: Secondary | ICD-10-CM | POA: Diagnosis not present

## 2015-07-21 DIAGNOSIS — D3500 Benign neoplasm of unspecified adrenal gland: Secondary | ICD-10-CM | POA: Diagnosis not present

## 2015-07-25 DIAGNOSIS — M8588 Other specified disorders of bone density and structure, other site: Secondary | ICD-10-CM | POA: Diagnosis not present

## 2015-07-25 DIAGNOSIS — D3500 Benign neoplasm of unspecified adrenal gland: Secondary | ICD-10-CM | POA: Diagnosis not present

## 2015-09-04 DIAGNOSIS — K140 Glossitis: Secondary | ICD-10-CM | POA: Diagnosis not present

## 2015-09-11 DIAGNOSIS — K5190 Ulcerative colitis, unspecified, without complications: Secondary | ICD-10-CM | POA: Diagnosis not present

## 2015-09-11 DIAGNOSIS — R12 Heartburn: Secondary | ICD-10-CM | POA: Diagnosis not present

## 2015-10-24 DIAGNOSIS — K6289 Other specified diseases of anus and rectum: Secondary | ICD-10-CM | POA: Diagnosis not present

## 2015-10-24 DIAGNOSIS — K317 Polyp of stomach and duodenum: Secondary | ICD-10-CM | POA: Diagnosis not present

## 2015-10-24 DIAGNOSIS — K2960 Other gastritis without bleeding: Secondary | ICD-10-CM | POA: Diagnosis not present

## 2015-10-24 DIAGNOSIS — K6389 Other specified diseases of intestine: Secondary | ICD-10-CM | POA: Diagnosis not present

## 2015-10-24 DIAGNOSIS — R12 Heartburn: Secondary | ICD-10-CM | POA: Diagnosis not present

## 2015-10-24 DIAGNOSIS — R112 Nausea with vomiting, unspecified: Secondary | ICD-10-CM | POA: Diagnosis not present

## 2015-10-24 DIAGNOSIS — R1013 Epigastric pain: Secondary | ICD-10-CM | POA: Diagnosis not present

## 2015-10-24 DIAGNOSIS — K449 Diaphragmatic hernia without obstruction or gangrene: Secondary | ICD-10-CM | POA: Diagnosis not present

## 2015-10-24 DIAGNOSIS — K2950 Unspecified chronic gastritis without bleeding: Secondary | ICD-10-CM | POA: Diagnosis not present

## 2015-10-24 DIAGNOSIS — K210 Gastro-esophageal reflux disease with esophagitis: Secondary | ICD-10-CM | POA: Diagnosis not present

## 2015-10-24 DIAGNOSIS — K5190 Ulcerative colitis, unspecified, without complications: Secondary | ICD-10-CM | POA: Diagnosis not present

## 2015-10-24 DIAGNOSIS — D131 Benign neoplasm of stomach: Secondary | ICD-10-CM | POA: Diagnosis not present

## 2015-11-20 DIAGNOSIS — D485 Neoplasm of uncertain behavior of skin: Secondary | ICD-10-CM | POA: Diagnosis not present

## 2015-11-20 DIAGNOSIS — L570 Actinic keratosis: Secondary | ICD-10-CM | POA: Diagnosis not present

## 2015-12-21 DIAGNOSIS — K5190 Ulcerative colitis, unspecified, without complications: Secondary | ICD-10-CM | POA: Diagnosis not present

## 2015-12-21 DIAGNOSIS — E782 Mixed hyperlipidemia: Secondary | ICD-10-CM | POA: Diagnosis not present

## 2015-12-21 DIAGNOSIS — Z0000 Encounter for general adult medical examination without abnormal findings: Secondary | ICD-10-CM | POA: Diagnosis not present

## 2015-12-28 DIAGNOSIS — E782 Mixed hyperlipidemia: Secondary | ICD-10-CM | POA: Diagnosis not present

## 2015-12-28 DIAGNOSIS — M8589 Other specified disorders of bone density and structure, multiple sites: Secondary | ICD-10-CM | POA: Diagnosis not present

## 2015-12-28 DIAGNOSIS — K5190 Ulcerative colitis, unspecified, without complications: Secondary | ICD-10-CM | POA: Diagnosis not present

## 2015-12-28 DIAGNOSIS — I10 Essential (primary) hypertension: Secondary | ICD-10-CM | POA: Diagnosis not present

## 2015-12-28 DIAGNOSIS — R12 Heartburn: Secondary | ICD-10-CM | POA: Diagnosis not present

## 2016-01-01 DIAGNOSIS — L905 Scar conditions and fibrosis of skin: Secondary | ICD-10-CM | POA: Diagnosis not present

## 2016-01-01 DIAGNOSIS — L928 Other granulomatous disorders of the skin and subcutaneous tissue: Secondary | ICD-10-CM | POA: Diagnosis not present

## 2016-01-22 DIAGNOSIS — M8580 Other specified disorders of bone density and structure, unspecified site: Secondary | ICD-10-CM | POA: Diagnosis not present

## 2016-02-01 DIAGNOSIS — H40003 Preglaucoma, unspecified, bilateral: Secondary | ICD-10-CM | POA: Diagnosis not present

## 2016-03-25 DIAGNOSIS — D1800 Hemangioma unspecified site: Secondary | ICD-10-CM | POA: Diagnosis not present

## 2016-03-25 DIAGNOSIS — R21 Rash and other nonspecific skin eruption: Secondary | ICD-10-CM | POA: Diagnosis not present

## 2016-03-25 DIAGNOSIS — I788 Other diseases of capillaries: Secondary | ICD-10-CM | POA: Diagnosis not present

## 2016-03-25 DIAGNOSIS — L438 Other lichen planus: Secondary | ICD-10-CM | POA: Diagnosis not present

## 2016-03-25 DIAGNOSIS — Z1283 Encounter for screening for malignant neoplasm of skin: Secondary | ICD-10-CM | POA: Diagnosis not present

## 2016-03-25 DIAGNOSIS — L905 Scar conditions and fibrosis of skin: Secondary | ICD-10-CM | POA: Diagnosis not present

## 2016-03-25 DIAGNOSIS — D229 Melanocytic nevi, unspecified: Secondary | ICD-10-CM | POA: Diagnosis not present

## 2016-03-25 DIAGNOSIS — L578 Other skin changes due to chronic exposure to nonionizing radiation: Secondary | ICD-10-CM | POA: Diagnosis not present

## 2016-03-25 DIAGNOSIS — L820 Inflamed seborrheic keratosis: Secondary | ICD-10-CM | POA: Diagnosis not present

## 2016-03-25 DIAGNOSIS — L821 Other seborrheic keratosis: Secondary | ICD-10-CM | POA: Diagnosis not present

## 2016-03-25 DIAGNOSIS — L814 Other melanin hyperpigmentation: Secondary | ICD-10-CM | POA: Diagnosis not present

## 2016-04-22 DIAGNOSIS — L438 Other lichen planus: Secondary | ICD-10-CM | POA: Diagnosis not present

## 2016-04-22 DIAGNOSIS — L821 Other seborrheic keratosis: Secondary | ICD-10-CM | POA: Diagnosis not present

## 2016-04-22 DIAGNOSIS — Z85828 Personal history of other malignant neoplasm of skin: Secondary | ICD-10-CM | POA: Diagnosis not present

## 2016-04-30 DIAGNOSIS — M1712 Unilateral primary osteoarthritis, left knee: Secondary | ICD-10-CM | POA: Diagnosis not present

## 2016-04-30 DIAGNOSIS — M25562 Pain in left knee: Secondary | ICD-10-CM | POA: Diagnosis not present

## 2016-06-11 ENCOUNTER — Other Ambulatory Visit: Payer: Self-pay | Admitting: Physician Assistant

## 2016-06-11 DIAGNOSIS — Z1231 Encounter for screening mammogram for malignant neoplasm of breast: Secondary | ICD-10-CM

## 2016-06-20 DIAGNOSIS — E782 Mixed hyperlipidemia: Secondary | ICD-10-CM | POA: Diagnosis not present

## 2016-06-20 DIAGNOSIS — I10 Essential (primary) hypertension: Secondary | ICD-10-CM | POA: Diagnosis not present

## 2016-06-20 DIAGNOSIS — M8589 Other specified disorders of bone density and structure, multiple sites: Secondary | ICD-10-CM | POA: Diagnosis not present

## 2016-06-27 DIAGNOSIS — M8589 Other specified disorders of bone density and structure, multiple sites: Secondary | ICD-10-CM | POA: Diagnosis not present

## 2016-06-27 DIAGNOSIS — K5190 Ulcerative colitis, unspecified, without complications: Secondary | ICD-10-CM | POA: Diagnosis not present

## 2016-06-27 DIAGNOSIS — I10 Essential (primary) hypertension: Secondary | ICD-10-CM | POA: Diagnosis not present

## 2016-06-27 DIAGNOSIS — D3500 Benign neoplasm of unspecified adrenal gland: Secondary | ICD-10-CM | POA: Diagnosis not present

## 2016-06-27 DIAGNOSIS — Z0000 Encounter for general adult medical examination without abnormal findings: Secondary | ICD-10-CM | POA: Diagnosis not present

## 2016-06-27 DIAGNOSIS — Z23 Encounter for immunization: Secondary | ICD-10-CM | POA: Diagnosis not present

## 2016-06-27 DIAGNOSIS — Z1231 Encounter for screening mammogram for malignant neoplasm of breast: Secondary | ICD-10-CM | POA: Diagnosis not present

## 2016-06-27 DIAGNOSIS — M545 Low back pain: Secondary | ICD-10-CM | POA: Diagnosis not present

## 2016-07-15 ENCOUNTER — Ambulatory Visit
Admission: RE | Admit: 2016-07-15 | Discharge: 2016-07-15 | Disposition: A | Discharge status: Home / Self Care | Payer: PPO | Source: Ambulatory Visit | Attending: Physician Assistant | Admitting: Physician Assistant

## 2016-07-15 DIAGNOSIS — Z1231 Encounter for screening mammogram for malignant neoplasm of breast: Secondary | ICD-10-CM | POA: Diagnosis not present

## 2016-08-25 IMAGING — CT CT CHEST W/ CM
1 of 2 series · 14 of 31 positions shown, 18 images · IV contrast (omnipaque)
Comparison: Two-view chest x-ray from the same day.

CLINICAL DATA: Abnormal chest x-ray.  Pulmonary nodules.

EXAM:
CT CHEST WITH CONTRAST
TECHNIQUE: Multidetector CT imaging of the chest was performed during
intravenous contrast administration.
CONTRAST:  75mL OMNIPAQUE IOHEXOL 300 MG/ML  SOLN

[Series 2: routine chest with · axial · 0.58mm/px · z∈[+380,+610]mm · 14 of 56 slices shown, 18 images]
[im 5/56  mediastinal]
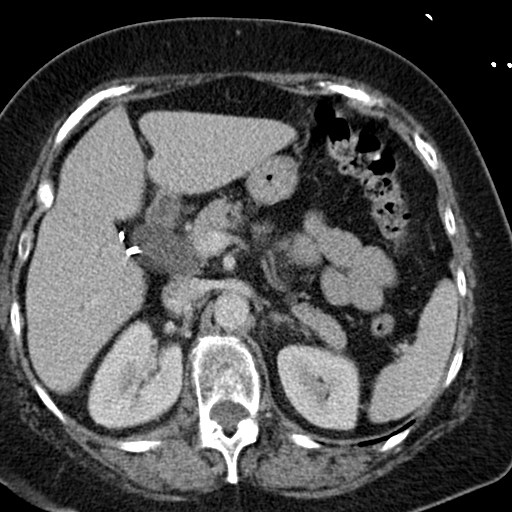
[im 5/56  lung]
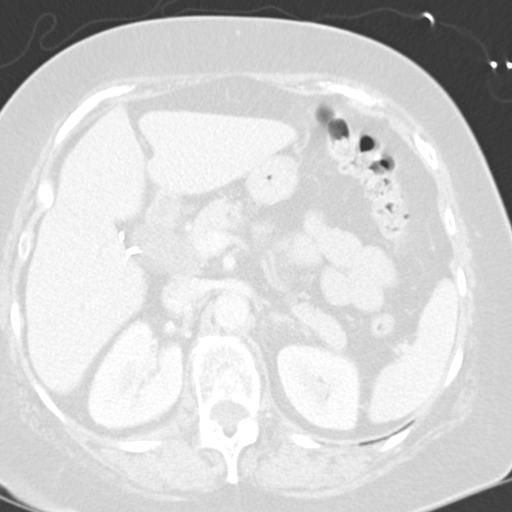
[im 9/56  lung]
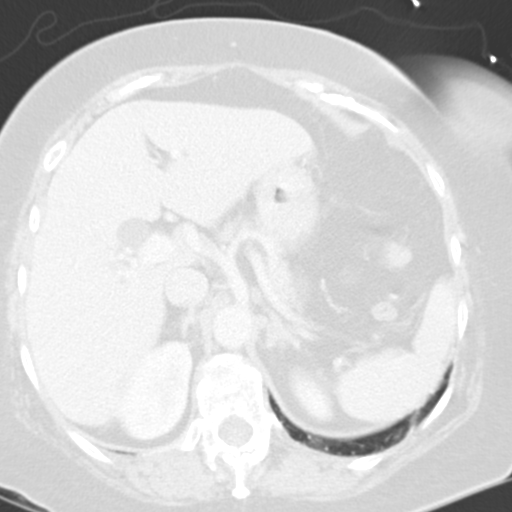
[im 13/56  lung]
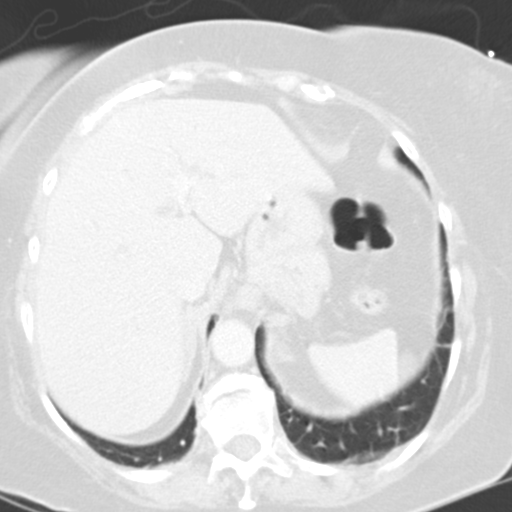
[im 17/56  lung]
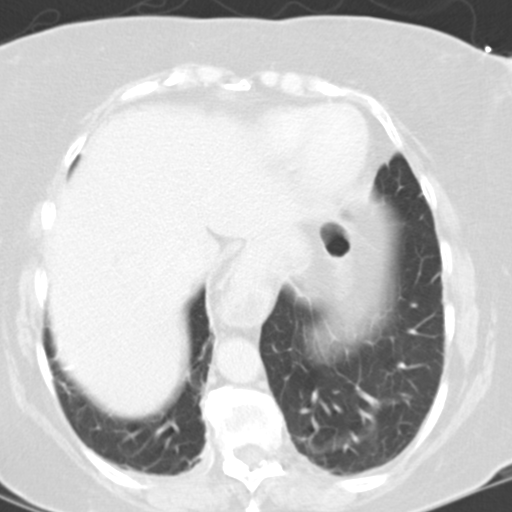
[im 22/56  mediastinal]
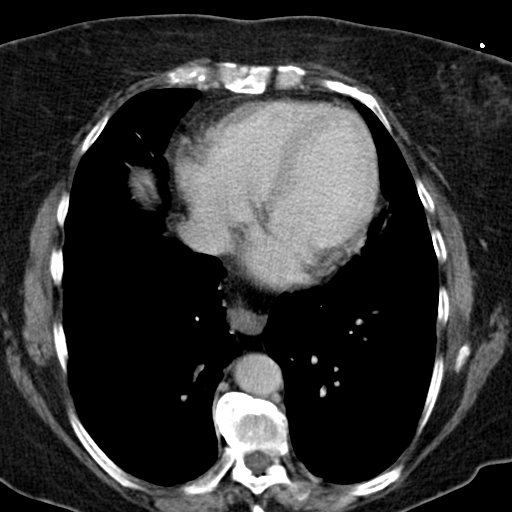
[im 22/56  lung]
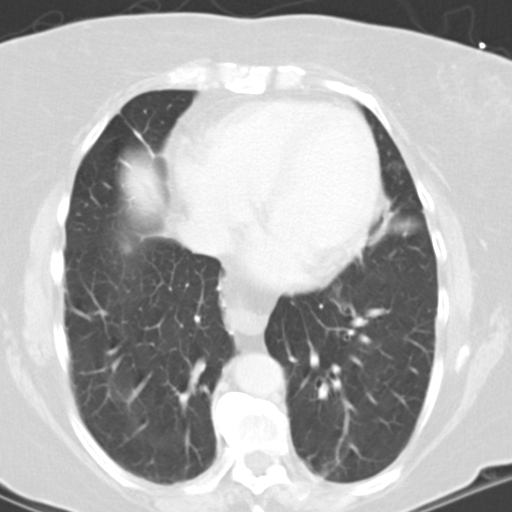
[im 26/56  lung]
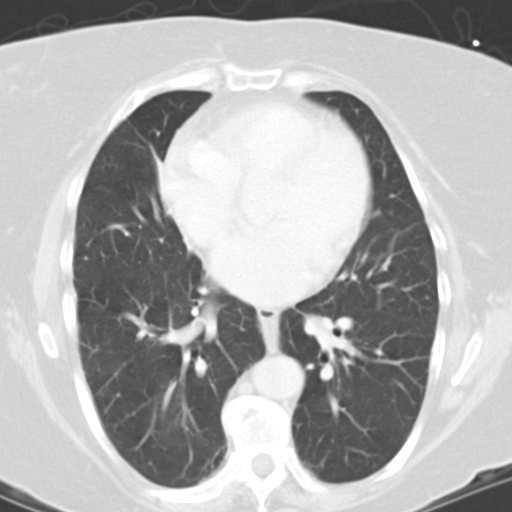
[im 27/56  lung]
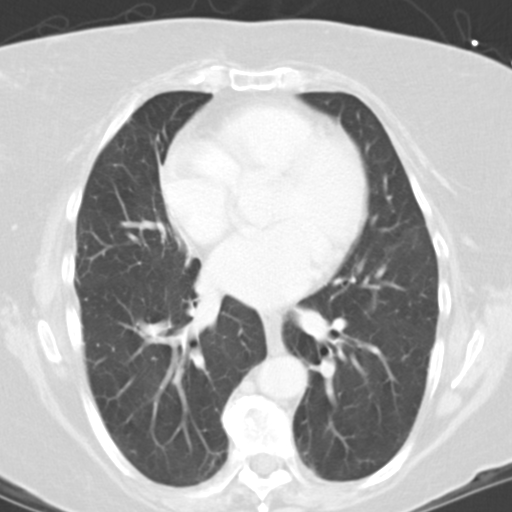
[im 28/56  lung]
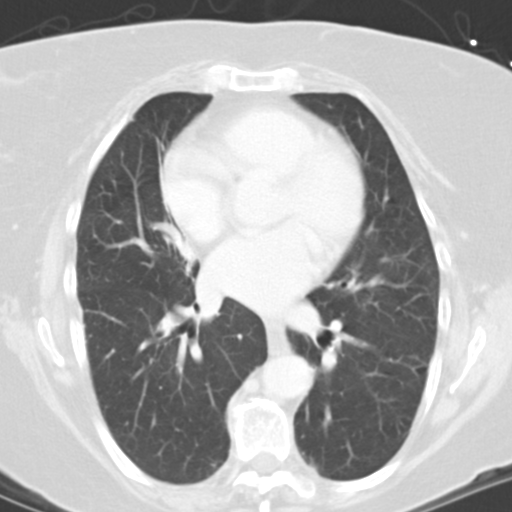
[im 30/56  mediastinal]
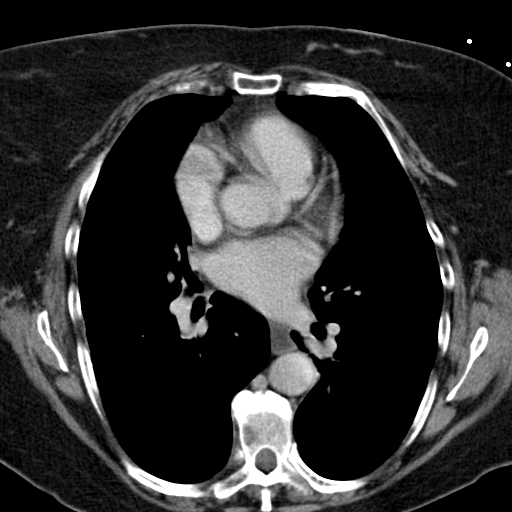
[im 30/56  lung]
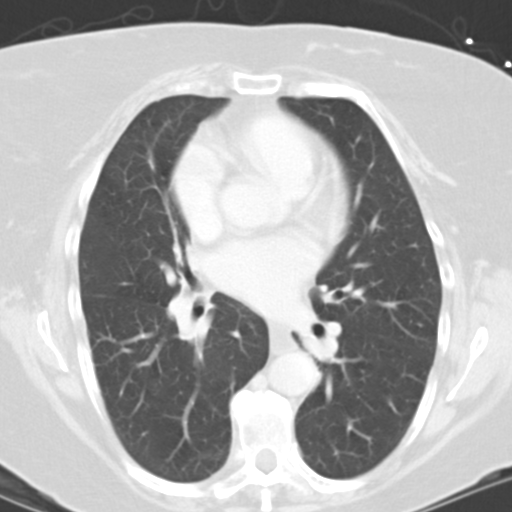
[im 34/56  lung]
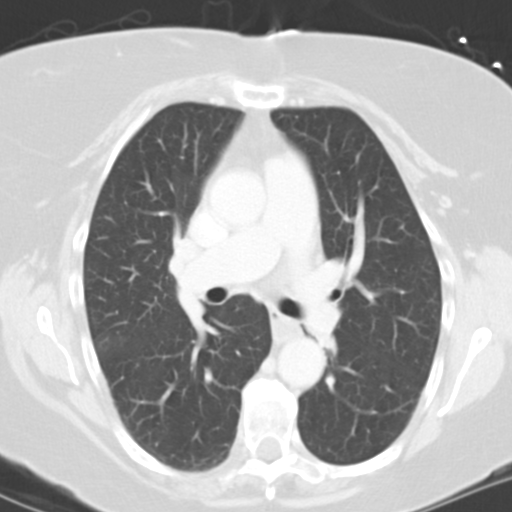
[im 39/56  lung]
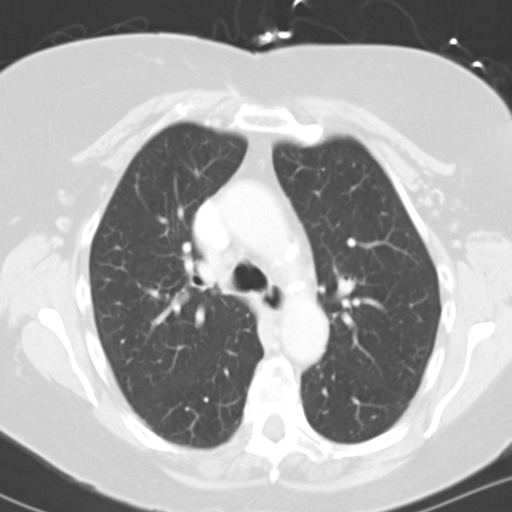
[im 43/56  lung]
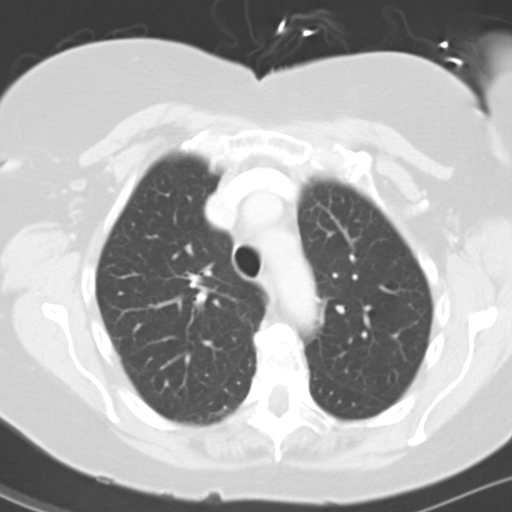
[im 47/56  mediastinal]
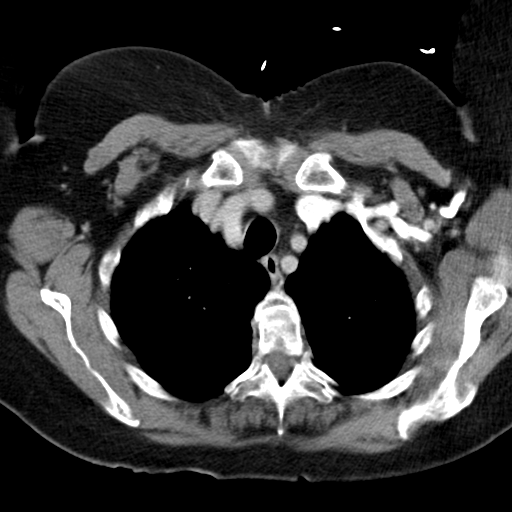
[im 47/56  lung]
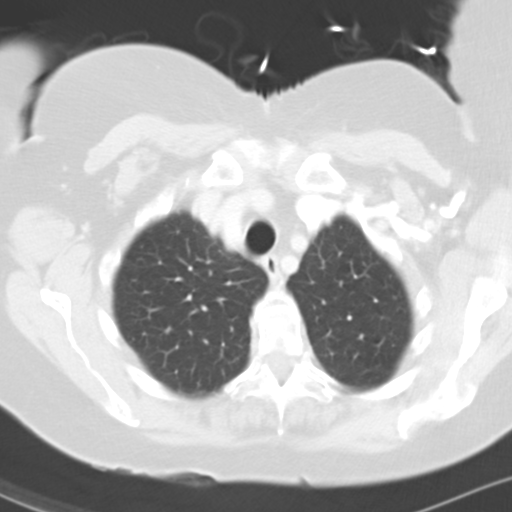
[im 51/56  lung]
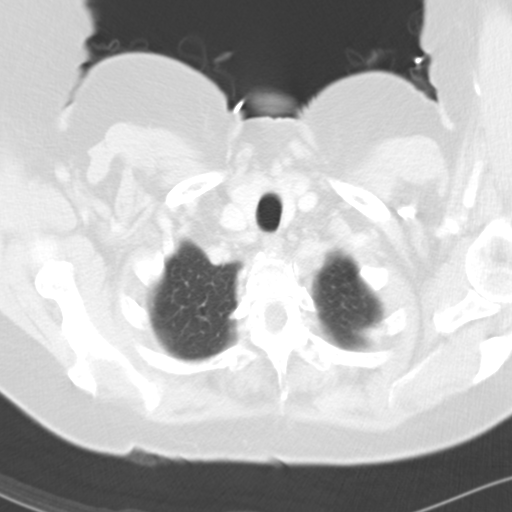

[14 of 31 positions shown; findings below may reference images not displayed]

FINDINGS: The heart size is normal. No significant mediastinal or axillary
adenopathy is present. The thoracic inlet is unremarkable. The
esophagus is mildly dilated and fluid-filled within the lower
mediastinum. A small hiatal hernia is present.

The common bile duct is moderately dilated following
cholecystectomy. It measures up to 2.0 cm. There is mild
intrahepatic biliary dilation is well. Limited imaging of the
abdomen is otherwise unremarkable.

The lungs are clear. No focal nodule or mass lesion is present.
There is no focal airspace disease. The potential pulmonary nodules
identified on chest x-ray likely reflect osseous changes related to
degenerative change at the first anterior costochondral junction on
the left and anterior rib on the right. There are no associated
pulmonary nodules

Bone windows demonstrate exaggerated thoracic kyphosis with fusion
of anterior osteophytes throughout the thoracic spine. Vertebral
body heights are overall maintained.
IMPRESSION: 1. No pulmonary nodules are present. The findings un chest
radiographs were a likely projectional, related to the ribs.
2. No evidence for neoplasm.
3. Mild dilation of the distal esophagus and small hiatal hernia.
4. Exaggerated thoracic kyphosis.
5. Moderate extrahepatic biliary dilation following cholecystectomy.
No obstructing lesion is seen. The distal CBD is incompletely
visualized.

## 2016-08-25 IMAGING — CR DG CHEST 2V
1 series · 2 of 2 positions shown · non-contrast
Comparison: None.

CLINICAL DATA: Weakness and chest pain.

EXAM:
CHEST - 2 VIEW

[Series 1: dg chest 2 view · 0.14mm/px · 2 of 2 slices shown]
[im 1/2]
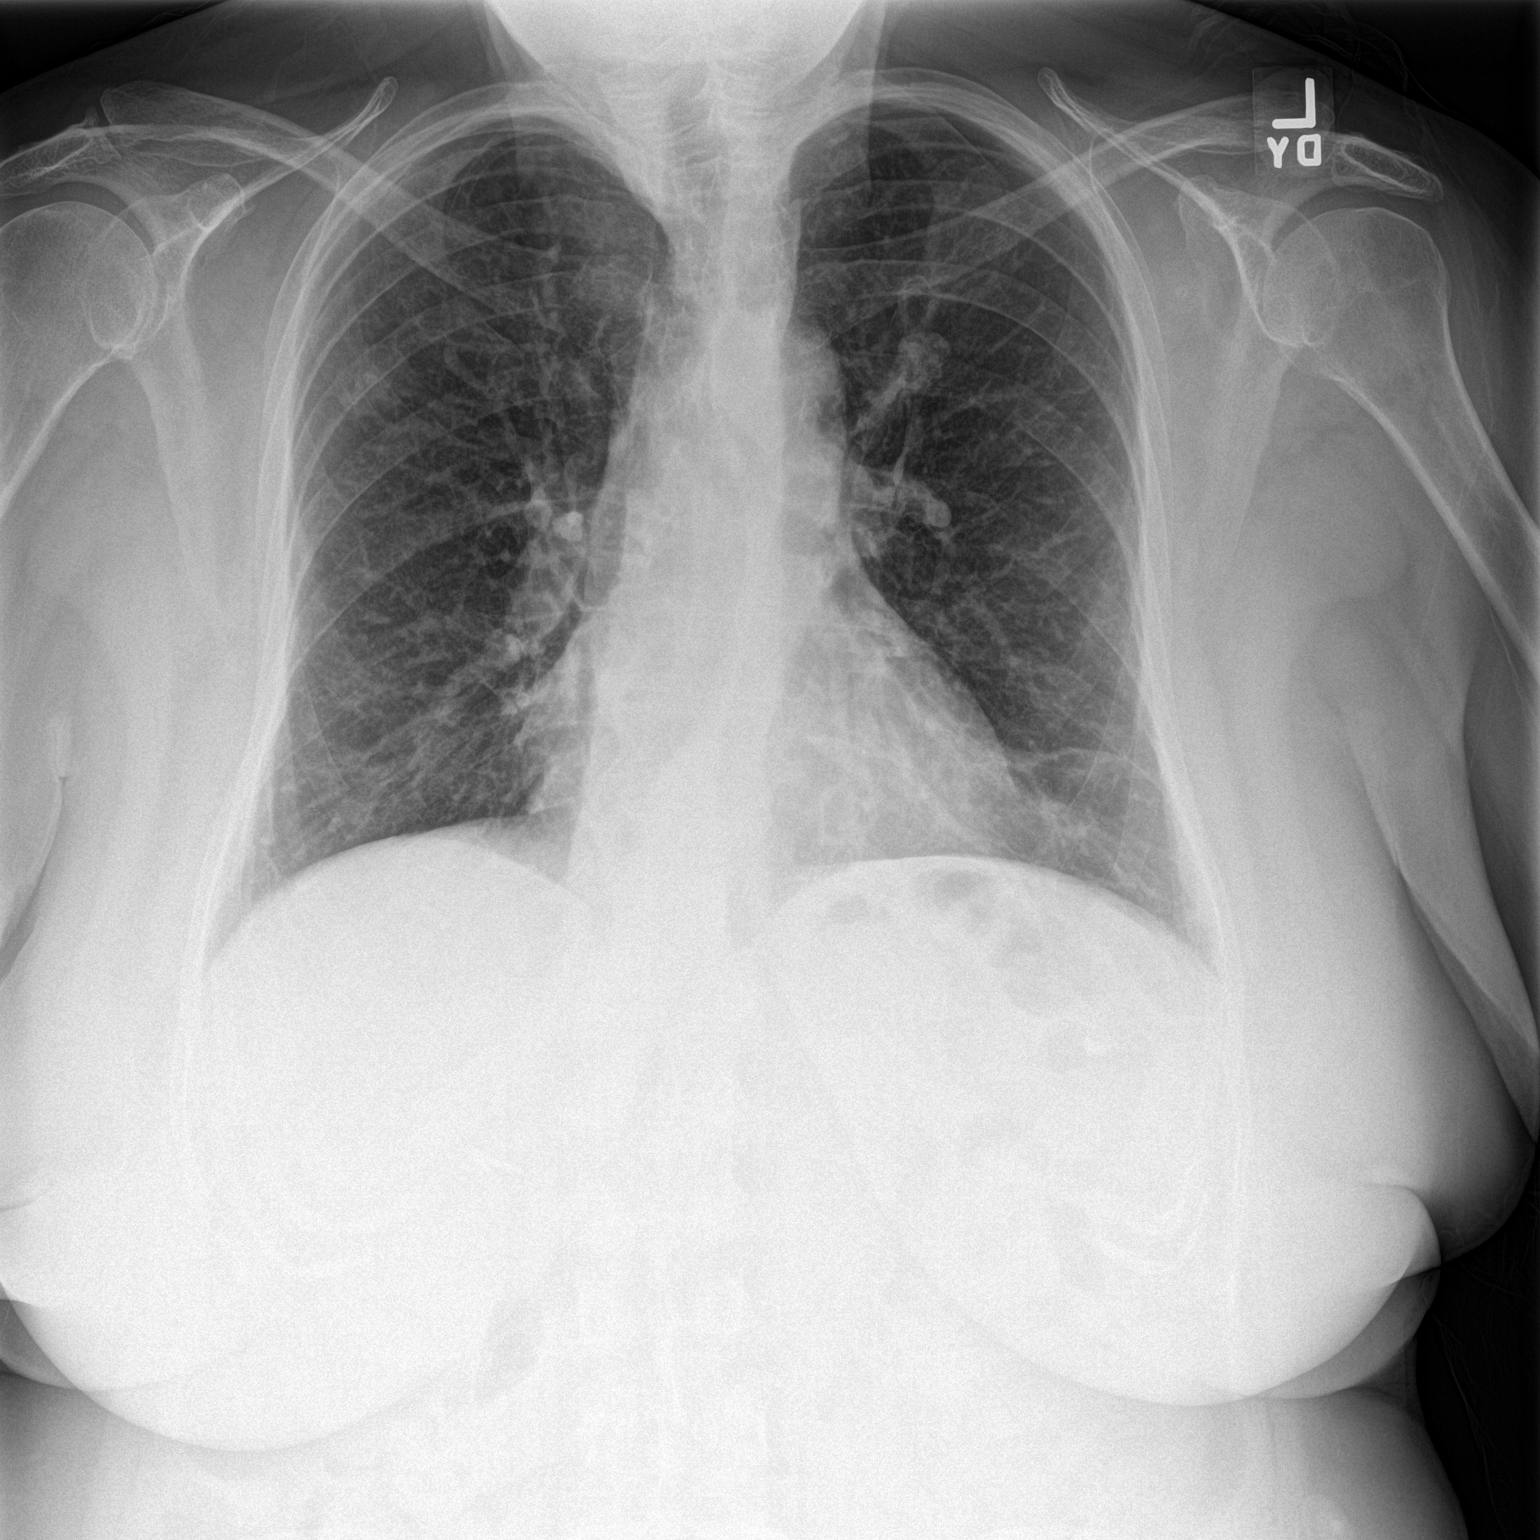
[im 2/2]
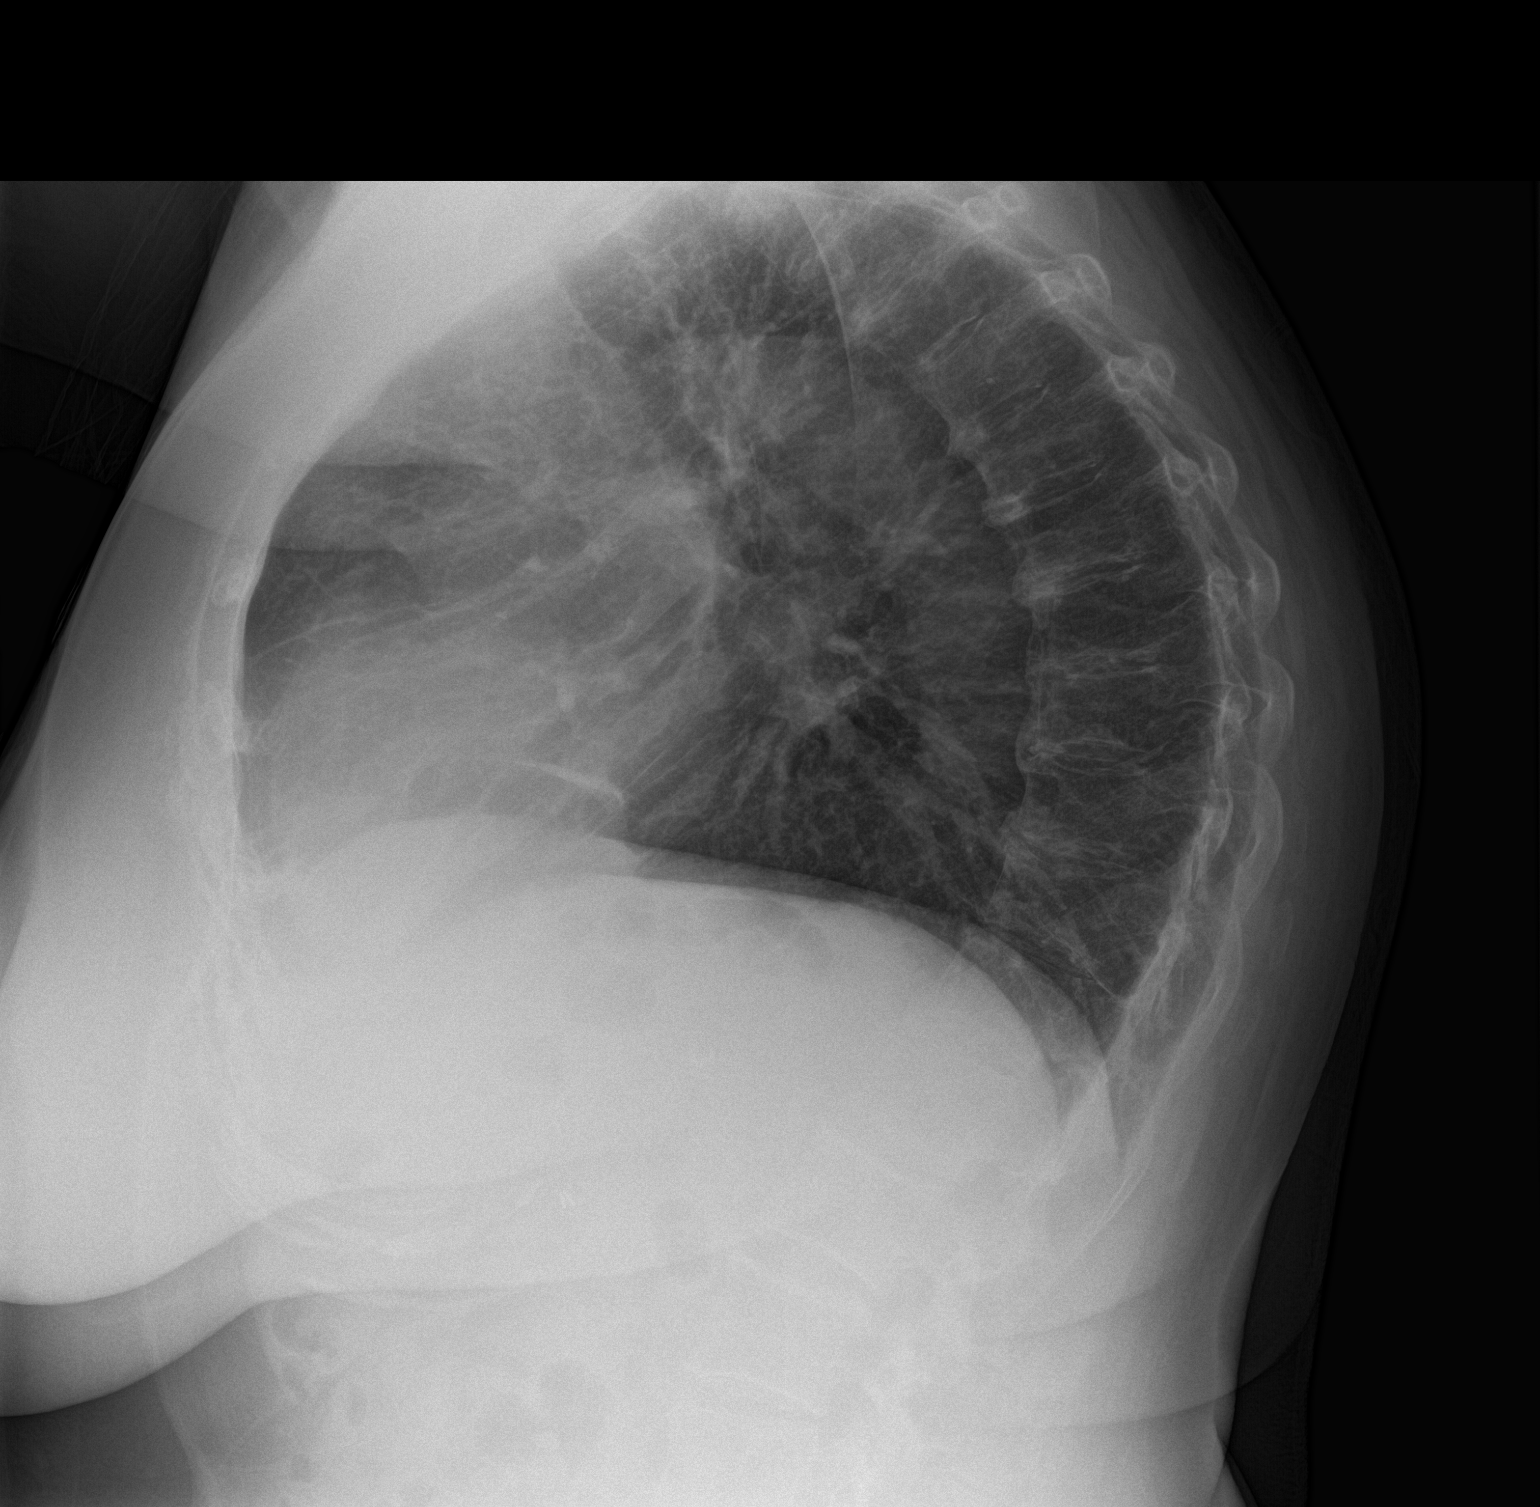

[2 of 2 positions shown; findings below may reference images not displayed]

FINDINGS: The heart size is normal. Bilateral upper lobe pulmonary nodules are
suggested measuring up to 2 cm. The lesion on the right may be
artifactual, associated with the anterior second rib. However it
appears to extend beyond the margins of the rib. Scarring or
atelectasis is present at the left lung base. Emphysematous changes
are noted. Exaggerated kyphosis and degenerative change is present
in the thoracic spine.
IMPRESSION: 1. Bilateral upper lobe pulmonary nodules measuring up to 2 cm.
Recommend CT of the chest with contrast for further evaluation.
2. Emphysema.
3. Atelectasis or scarring at the left lung base.
These results will be called to the ordering clinician or
representative by the Radiologist Assistant, and communication
documented in the PACS or zVision Dashboard.

## 2016-10-21 DIAGNOSIS — L821 Other seborrheic keratosis: Secondary | ICD-10-CM | POA: Diagnosis not present

## 2016-10-21 DIAGNOSIS — Z85828 Personal history of other malignant neoplasm of skin: Secondary | ICD-10-CM | POA: Diagnosis not present

## 2016-10-21 DIAGNOSIS — L570 Actinic keratosis: Secondary | ICD-10-CM | POA: Diagnosis not present

## 2016-10-21 DIAGNOSIS — L578 Other skin changes due to chronic exposure to nonionizing radiation: Secondary | ICD-10-CM | POA: Diagnosis not present

## 2016-10-21 DIAGNOSIS — D692 Other nonthrombocytopenic purpura: Secondary | ICD-10-CM | POA: Diagnosis not present

## 2016-12-12 DIAGNOSIS — K5130 Ulcerative (chronic) rectosigmoiditis without complications: Secondary | ICD-10-CM | POA: Diagnosis not present

## 2017-01-02 DIAGNOSIS — R51 Headache: Secondary | ICD-10-CM | POA: Diagnosis not present

## 2017-01-02 DIAGNOSIS — R112 Nausea with vomiting, unspecified: Secondary | ICD-10-CM | POA: Diagnosis not present

## 2017-01-06 DIAGNOSIS — M8588 Other specified disorders of bone density and structure, other site: Secondary | ICD-10-CM | POA: Diagnosis not present

## 2017-01-09 DIAGNOSIS — M546 Pain in thoracic spine: Secondary | ICD-10-CM | POA: Diagnosis not present

## 2017-02-06 DIAGNOSIS — H2513 Age-related nuclear cataract, bilateral: Secondary | ICD-10-CM | POA: Diagnosis not present

## 2017-02-14 DIAGNOSIS — M8580 Other specified disorders of bone density and structure, unspecified site: Secondary | ICD-10-CM | POA: Diagnosis not present

## 2017-04-09 ENCOUNTER — Other Ambulatory Visit: Payer: Self-pay | Admitting: Orthopedic Surgery

## 2017-04-09 DIAGNOSIS — M1712 Unilateral primary osteoarthritis, left knee: Secondary | ICD-10-CM

## 2017-04-29 ENCOUNTER — Ambulatory Visit
Admission: RE | Admit: 2017-04-29 | Discharge: 2017-04-29 | Disposition: A | Discharge status: Home / Self Care | Payer: Medicare HMO | Source: Ambulatory Visit | Attending: Orthopedic Surgery | Admitting: Orthopedic Surgery

## 2017-04-29 DIAGNOSIS — M1712 Unilateral primary osteoarthritis, left knee: Secondary | ICD-10-CM | POA: Insufficient documentation

## 2017-05-28 ENCOUNTER — Encounter
Admission: RE | Admit: 2017-05-28 | Discharge: 2017-05-28 | Disposition: A | Discharge status: Home / Self Care | Payer: Medicare HMO | Source: Ambulatory Visit | Attending: Orthopedic Surgery | Admitting: Orthopedic Surgery

## 2017-05-28 ENCOUNTER — Other Ambulatory Visit: Payer: Self-pay

## 2017-05-28 DIAGNOSIS — Z01810 Encounter for preprocedural cardiovascular examination: Secondary | ICD-10-CM | POA: Diagnosis present

## 2017-05-28 DIAGNOSIS — Z01812 Encounter for preprocedural laboratory examination: Secondary | ICD-10-CM | POA: Diagnosis present

## 2017-05-28 DIAGNOSIS — I10 Essential (primary) hypertension: Secondary | ICD-10-CM | POA: Insufficient documentation

## 2017-05-28 DIAGNOSIS — I4510 Unspecified right bundle-branch block: Secondary | ICD-10-CM | POA: Diagnosis not present

## 2017-05-28 HISTORY — DX: Personal history of other diseases of the digestive system: Z87.19

## 2017-05-28 HISTORY — DX: Family history of other specified conditions: Z84.89

## 2017-05-28 HISTORY — DX: Gastro-esophageal reflux disease without esophagitis: K21.9

## 2017-05-28 LAB — BASIC METABOLIC PANEL
ANION GAP: 11 (ref 5–15)
BUN: 13 mg/dL (ref 6–20)
CHLORIDE: 102 mmol/L (ref 101–111)
CO2: 28 mmol/L (ref 22–32)
Calcium: 9.6 mg/dL (ref 8.9–10.3)
Creatinine, Ser: 0.74 mg/dL (ref 0.44–1.00)
GFR calc Af Amer: >60 mL/min (ref >60)
GFR calc non Af Amer: >60 mL/min (ref >60)
GLUCOSE: 88 mg/dL (ref 65–99)
POTASSIUM: 3.8 mmol/L (ref 3.5–5.1)
Sodium: 141 mmol/L (ref 135–145)

## 2017-05-28 LAB — URINALYSIS, ROUTINE W REFLEX MICROSCOPIC
Bilirubin Urine: NEGATIVE
Glucose, UA: NEGATIVE mg/dL
HGB URINE DIPSTICK: NEGATIVE
Ketones, ur: NEGATIVE mg/dL
LEUKOCYTES UA: NEGATIVE
NITRITE: NEGATIVE
PROTEIN: NEGATIVE mg/dL
Specific Gravity, Urine: 1.005 (ref 1.005–1.030)
pH: 7 (ref 5.0–8.0)

## 2017-05-28 LAB — SURGICAL PCR SCREEN
MRSA, PCR: NEGATIVE
Staphylococcus aureus: POSITIVE — AB

## 2017-05-28 LAB — SEDIMENTATION RATE: Sed Rate: 8 mm/hr (ref 0–30)

## 2017-05-28 LAB — CBC
HCT: 45.2 % (ref 35.0–47.0)
Hemoglobin: 14.7 g/dL (ref 12.0–16.0)
MCH: 28.9 pg (ref 26.0–34.0)
MCHC: 32.5 g/dL (ref 32.0–36.0)
MCV: 89 fL (ref 80.0–100.0)
Platelets: 221 10*3/uL (ref 150–440)
RBC: 5.07 MIL/uL (ref 3.80–5.20)
RDW: 14.2 % (ref 11.5–14.5)
WBC: 7 10*3/uL (ref 3.6–11.0)

## 2017-05-28 LAB — APTT: APTT: 33 s (ref 24–36)

## 2017-05-28 LAB — TYPE AND SCREEN
ABO/RH(D): O POS
ANTIBODY SCREEN: NEGATIVE

## 2017-05-28 LAB — PROTIME-INR
INR: 0.93
Prothrombin Time: 12.4 seconds (ref 11.4–15.2)

## 2017-05-28 NOTE — Pre-Procedure Instructions (Signed)
Paient states protonix makes her nauseated if she does not eat after taking it. Pepcid ordered per protocol.

## 2017-05-28 NOTE — Patient Instructions (Signed)
Your procedure is scheduled on: Tuesday 06/10/17 Report to [place]. To find out your arrival time please call [phone] between 1PM - 3PM on Monday 06/09/17.  Remember: Instructions that are not followed completely may result in serious medical risk, up to and including death, or upon the discretion of your surgeon and anesthesiologist your surgery may need to be rescheduled.     _X__ 1. Do not eat food after midnight the night before your procedure.                 No gum chewing or hard candies. You may drink clear liquids up to 2 hours                 before you are scheduled to arrive for your surgery- DO not drink clear                 liquids within 2 hours of the start of your surgery.                 Clear Liquids include:  water, apple juice without pulp, clear carbohydrate                 drink such as Clearfast or Gatorade, Black Coffee or Tea (Do not add                 anything to coffee or tea).  __X__2.  On the morning of surgery brush your teeth with toothpaste and water, you                 may rinse your mouth with mouthwash if you wish.  Do not swallow any              toothpaste of mouthwash.     _X__ 3.  No Alcohol for 24 hours before or after surgery.   _X__ 4.  Do Not Smoke or use e-cigarettes For 24 Hours Prior to Your Surgery.                 Do not use any chewable tobacco products for at least 6 hours prior to                 surgery.  ____  5.  Bring all medications with you on the day of surgery if instructed.   __X__  6.  Notify your doctor if there is any change in your medical condition      (cold, fever, infections).     Do not wear jewelry, make-up, hairpins, clips or nail polish. Do not wear lotions, powders, or perfumes.  Do not shave 48 hours prior to surgery. Men may shave face and neck. Do not bring valuables to the hospital.    [facility] is not responsible for any belongings or  valuables.  Contacts, dentures/partials or body piercings may not be worn into surgery. Bring a case for your contacts, glasses or hearing aids, a denture cup will be supplied. Leave your suitcase in the car. After surgery it may be brought to your room. For patients admitted to the hospital, discharge time is determined by your treatment team.   Patients discharged the day of surgery will not be allowed to drive home.   Please read over the following fact sheets that you were given:   MRSA Information  __X__ Take these medicines the morning of surgery with A SIP OF WATER:  1. Pantoprazole  2. Mesalamine  3.   4.  5.  6.  ____ Fleet Enema (as directed)   __X__ Use CHG Soap/SAGE wipes as directed  ____ Use inhalers on the day of surgery  ____ Stop metformin/Janumet/Farxiga 2 days prior to surgery    ____ Take 1/2 of usual insulin dose the night before surgery. No insulin the morning          of surgery.   ____ Stop Blood Thinners Coumadin/Plavix/Xarelto/Pleta/Pradaxa/Eliquis/Effient/Aspirin  on   Or contact your Surgeon, Cardiologist or Medical Doctor regarding  ability to stop your blood thinners  __X__ Stop Anti-inflammatories 7 days before surgery such as Advil, Ibuprofen, Motrin,  BC or Goodies Powder, Naprosyn, Naproxen, Aleve, Aspirin OK TO CONTINUE TYLENOL   __X__ Stop all herbal supplements, fish oil or vitamin E until after surgery.    ____ Bring C-Pap to the hospital.

## 2017-05-29 LAB — URINE CULTURE: CULTURE: NO GROWTH

## 2017-06-06 ENCOUNTER — Other Ambulatory Visit: Payer: Self-pay | Admitting: Physician Assistant

## 2017-06-06 DIAGNOSIS — Z1231 Encounter for screening mammogram for malignant neoplasm of breast: Secondary | ICD-10-CM

## 2017-06-10 ENCOUNTER — Inpatient Hospital Stay: Payer: Medicare HMO

## 2017-06-10 ENCOUNTER — Encounter
Admission: RE | Disposition: A | Discharge status: Home / Self Care | Payer: Self-pay | Source: Ambulatory Visit | Attending: Orthopedic Surgery

## 2017-06-10 ENCOUNTER — Inpatient Hospital Stay: Payer: Medicare HMO | Admitting: Anesthesiology

## 2017-06-10 ENCOUNTER — Inpatient Hospital Stay
Admission: RE | Admit: 2017-06-10 | Discharge: 2017-06-12 | DRG: 470 | Disposition: A | Discharge status: Home / Self Care | Payer: Medicare HMO | Source: Ambulatory Visit | Attending: Orthopedic Surgery | Admitting: Orthopedic Surgery

## 2017-06-10 ENCOUNTER — Other Ambulatory Visit: Payer: Self-pay

## 2017-06-10 ENCOUNTER — Encounter: Payer: Self-pay | Admitting: *Deleted

## 2017-06-10 DIAGNOSIS — Z888 Allergy status to other drugs, medicaments and biological substances status: Secondary | ICD-10-CM | POA: Diagnosis not present

## 2017-06-10 DIAGNOSIS — Z800 Family history of malignant neoplasm of digestive organs: Secondary | ICD-10-CM

## 2017-06-10 DIAGNOSIS — Z791 Long term (current) use of non-steroidal anti-inflammatories (NSAID): Secondary | ICD-10-CM

## 2017-06-10 DIAGNOSIS — K5190 Ulcerative colitis, unspecified, without complications: Secondary | ICD-10-CM | POA: Diagnosis present

## 2017-06-10 DIAGNOSIS — Z96652 Presence of left artificial knee joint: Secondary | ICD-10-CM

## 2017-06-10 DIAGNOSIS — I10 Essential (primary) hypertension: Secondary | ICD-10-CM | POA: Diagnosis present

## 2017-06-10 DIAGNOSIS — K219 Gastro-esophageal reflux disease without esophagitis: Secondary | ICD-10-CM | POA: Diagnosis present

## 2017-06-10 DIAGNOSIS — M1712 Unilateral primary osteoarthritis, left knee: Principal | ICD-10-CM | POA: Diagnosis present

## 2017-06-10 DIAGNOSIS — H25019 Cortical age-related cataract, unspecified eye: Secondary | ICD-10-CM | POA: Diagnosis present

## 2017-06-10 DIAGNOSIS — Z91040 Latex allergy status: Secondary | ICD-10-CM

## 2017-06-10 DIAGNOSIS — Z8249 Family history of ischemic heart disease and other diseases of the circulatory system: Secondary | ICD-10-CM

## 2017-06-10 DIAGNOSIS — M8580 Other specified disorders of bone density and structure, unspecified site: Secondary | ICD-10-CM | POA: Diagnosis present

## 2017-06-10 DIAGNOSIS — Z885 Allergy status to narcotic agent status: Secondary | ICD-10-CM | POA: Diagnosis not present

## 2017-06-10 DIAGNOSIS — Z79899 Other long term (current) drug therapy: Secondary | ICD-10-CM | POA: Diagnosis not present

## 2017-06-10 DIAGNOSIS — G8918 Other acute postprocedural pain: Secondary | ICD-10-CM

## 2017-06-10 DIAGNOSIS — E785 Hyperlipidemia, unspecified: Secondary | ICD-10-CM | POA: Diagnosis present

## 2017-06-10 DIAGNOSIS — Z882 Allergy status to sulfonamides status: Secondary | ICD-10-CM

## 2017-06-10 DIAGNOSIS — Z9049 Acquired absence of other specified parts of digestive tract: Secondary | ICD-10-CM | POA: Diagnosis not present

## 2017-06-10 DIAGNOSIS — Z8262 Family history of osteoporosis: Secondary | ICD-10-CM | POA: Diagnosis not present

## 2017-06-10 DIAGNOSIS — R079 Chest pain, unspecified: Secondary | ICD-10-CM

## 2017-06-10 DIAGNOSIS — Z90710 Acquired absence of both cervix and uterus: Secondary | ICD-10-CM

## 2017-06-10 HISTORY — PX: TOTAL KNEE ARTHROPLASTY: SHX125

## 2017-06-10 LAB — CBC
HCT: 40.7 % (ref 35.0–47.0)
HEMOGLOBIN: 13.5 g/dL (ref 12.0–16.0)
MCH: 29.5 pg (ref 26.0–34.0)
MCHC: 33.2 g/dL (ref 32.0–36.0)
MCV: 88.9 fL (ref 80.0–100.0)
Platelets: 224 10*3/uL (ref 150–440)
RBC: 4.57 MIL/uL (ref 3.80–5.20)
RDW: 13.8 % (ref 11.5–14.5)
WBC: 14.5 10*3/uL — AB (ref 3.6–11.0)

## 2017-06-10 LAB — CREATININE, SERUM
Creatinine, Ser: 0.55 mg/dL (ref 0.44–1.00)
GFR calc non Af Amer: >60 mL/min (ref >60)

## 2017-06-10 LAB — ABO/RH: ABO/RH(D): O POS

## 2017-06-10 SURGERY — ARTHROPLASTY, KNEE, TOTAL
Anesthesia: Spinal | Laterality: Left

## 2017-06-10 MED ORDER — SODIUM CHLORIDE 0.9 % IJ SOLN
INTRAMUSCULAR | Status: DC | PRN
  Administered 2017-06-10: 50 mL via INTRAVENOUS

## 2017-06-10 MED ORDER — OXYCODONE HCL 5 MG PO TABS
10.0000 mg | ORAL_TABLET | ORAL | Status: DC | PRN
  Administered 2017-06-11 – 2017-06-12 (×2): 10 mg via ORAL
  Filled 2017-06-10: qty 2

## 2017-06-10 MED ORDER — CHOLECALCIFEROL 10 MCG (400 UNIT) PO TABS
400.0000 [IU] | ORAL_TABLET | Freq: Two times a day (BID) | ORAL | Status: DC
  Administered 2017-06-10 – 2017-06-12 (×3): 400 [IU] via ORAL
  Filled 2017-06-10 (×6): qty 1

## 2017-06-10 MED ORDER — BUPIVACAINE HCL (PF) 0.5 % IJ SOLN
INTRAMUSCULAR | Status: AC
  Filled 2017-06-10: qty 10

## 2017-06-10 MED ORDER — SENNOSIDES-DOCUSATE SODIUM 8.6-50 MG PO TABS: 1.0000 | ORAL_TABLET | Freq: Every evening | ORAL | Status: DC | PRN

## 2017-06-10 MED ORDER — FENTANYL CITRATE (PF) 100 MCG/2ML IJ SOLN
INTRAMUSCULAR | Status: AC
  Filled 2017-06-10: qty 2

## 2017-06-10 MED ORDER — MENTHOL 3 MG MT LOZG
1.0000 | LOZENGE | OROMUCOSAL | Status: DC | PRN
  Administered 2017-06-10: 3 mg via ORAL
  Filled 2017-06-10 (×4): qty 9

## 2017-06-10 MED ORDER — FENTANYL CITRATE (PF) 100 MCG/2ML IJ SOLN
INTRAMUSCULAR | Status: DC | PRN
  Administered 2017-06-10 (×2): 50 ug via INTRAVENOUS

## 2017-06-10 MED ORDER — ACETAMINOPHEN 500 MG PO TABS
1000.0000 mg | ORAL_TABLET | Freq: Four times a day (QID) | ORAL | Status: AC
  Administered 2017-06-10 – 2017-06-11 (×3): 1000 mg via ORAL
  Filled 2017-06-10 (×3): qty 2

## 2017-06-10 MED ORDER — TRANEXAMIC ACID 1000 MG/10ML IV SOLN
1000.0000 mg | INTRAVENOUS | Status: AC
  Administered 2017-06-10: 1000 mg via INTRAVENOUS
  Filled 2017-06-10: qty 10

## 2017-06-10 MED ORDER — METHOCARBAMOL 1000 MG/10ML IJ SOLN
500.0000 mg | Freq: Four times a day (QID) | INTRAVENOUS | Status: DC | PRN
  Filled 2017-06-10 (×4): qty 5

## 2017-06-10 MED ORDER — GLYCOPYRROLATE 0.2 MG/ML IJ SOLN
INTRAMUSCULAR | Status: AC
  Filled 2017-06-10: qty 1

## 2017-06-10 MED ORDER — MIDAZOLAM HCL 2 MG/2ML IJ SOLN
INTRAMUSCULAR | Status: AC
  Filled 2017-06-10: qty 2

## 2017-06-10 MED ORDER — BUPIVACAINE HCL (PF) 0.5 % IJ SOLN
INTRAMUSCULAR | Status: DC | PRN
  Administered 2017-06-10: 3 mL via INTRATHECAL

## 2017-06-10 MED ORDER — BUPIVACAINE-EPINEPHRINE (PF) 0.25% -1:200000 IJ SOLN
INTRAMUSCULAR | Status: DC | PRN
  Administered 2017-06-10: 30 mL via PERINEURAL

## 2017-06-10 MED ORDER — GLYCOPYRROLATE 0.2 MG/ML IJ SOLN
INTRAMUSCULAR | Status: DC | PRN
  Administered 2017-06-10: 0.2 mg via INTRAVENOUS

## 2017-06-10 MED ORDER — METOCLOPRAMIDE HCL 5 MG/ML IJ SOLN: 5.0000 mg | Freq: Three times a day (TID) | INTRAMUSCULAR | Status: DC | PRN

## 2017-06-10 MED ORDER — NEOMYCIN-POLYMYXIN B GU 40-200000 IR SOLN
Status: DC | PRN
  Administered 2017-06-10: 14 mL

## 2017-06-10 MED ORDER — CEFAZOLIN SODIUM-DEXTROSE 2-4 GM/100ML-% IV SOLN
2.0000 g | Freq: Once | INTRAVENOUS | Status: DC
  Filled 2017-06-10: qty 100

## 2017-06-10 MED ORDER — CEFAZOLIN SODIUM-DEXTROSE 2-3 GM-%(50ML) IV SOLR
INTRAVENOUS | Status: DC | PRN
  Administered 2017-06-10: 2 g via INTRAVENOUS

## 2017-06-10 MED ORDER — ONDANSETRON HCL 4 MG/2ML IJ SOLN
INTRAMUSCULAR | Status: AC
  Administered 2017-06-10: 4 mg via INTRAVENOUS
  Filled 2017-06-10: qty 2

## 2017-06-10 MED ORDER — FAMOTIDINE 20 MG PO TABS
20.0000 mg | ORAL_TABLET | Freq: Once | ORAL | Status: AC
  Administered 2017-06-10: 20 mg via ORAL

## 2017-06-10 MED ORDER — PANTOPRAZOLE SODIUM 40 MG PO TBEC
40.0000 mg | DELAYED_RELEASE_TABLET | Freq: Two times a day (BID) | ORAL | Status: DC
  Administered 2017-06-10 – 2017-06-12 (×4): 40 mg via ORAL
  Filled 2017-06-10 (×4): qty 1

## 2017-06-10 MED ORDER — METHOCARBAMOL 500 MG PO TABS
500.0000 mg | ORAL_TABLET | Freq: Four times a day (QID) | ORAL | Status: DC | PRN
  Administered 2017-06-10 – 2017-06-11 (×4): 500 mg via ORAL
  Filled 2017-06-10 (×4): qty 1

## 2017-06-10 MED ORDER — ALUM & MAG HYDROXIDE-SIMETH 200-200-20 MG/5ML PO SUSP: 30.0000 mL | ORAL | Status: DC | PRN

## 2017-06-10 MED ORDER — LOSARTAN POTASSIUM 25 MG PO TABS
25.0000 mg | ORAL_TABLET | Freq: Every day | ORAL | Status: DC
  Administered 2017-06-11 – 2017-06-12 (×2): 25 mg via ORAL
  Filled 2017-06-10: qty 1

## 2017-06-10 MED ORDER — ONDANSETRON HCL 4 MG/2ML IJ SOLN
4.0000 mg | Freq: Once | INTRAMUSCULAR | Status: AC | PRN
  Administered 2017-06-10: 4 mg via INTRAVENOUS

## 2017-06-10 MED ORDER — HYDROMORPHONE HCL 1 MG/ML IJ SOLN: 0.5000 mg | INTRAMUSCULAR | Status: DC | PRN

## 2017-06-10 MED ORDER — PROPOFOL 500 MG/50ML IV EMUL
INTRAVENOUS | Status: DC | PRN
  Administered 2017-06-10: 50 ug/kg/min via INTRAVENOUS

## 2017-06-10 MED ORDER — ACETAMINOPHEN 10 MG/ML IV SOLN
INTRAVENOUS | Status: AC
  Filled 2017-06-10: qty 100

## 2017-06-10 MED ORDER — PROMETHAZINE HCL 25 MG/ML IJ SOLN
6.2500 mg | Freq: Four times a day (QID) | INTRAMUSCULAR | Status: DC | PRN
  Administered 2017-06-10: 6.25 mg via INTRAVENOUS

## 2017-06-10 MED ORDER — BISACODYL 10 MG RE SUPP
10.0000 mg | Freq: Every day | RECTAL | Status: DC | PRN
  Administered 2017-06-12: 10 mg via RECTAL
  Filled 2017-06-10: qty 1

## 2017-06-10 MED ORDER — SODIUM CHLORIDE 0.9 % IV SOLN
INTRAVENOUS | Status: DC | PRN
  Administered 2017-06-10: 60 mL

## 2017-06-10 MED ORDER — PHENYLEPHRINE HCL 10 MG/ML IJ SOLN
INTRAVENOUS | Status: DC | PRN
  Administered 2017-06-10: 10 ug/min via INTRAVENOUS

## 2017-06-10 MED ORDER — ZOLPIDEM TARTRATE 5 MG PO TABS: 5.0000 mg | ORAL_TABLET | Freq: Every evening | ORAL | Status: DC | PRN

## 2017-06-10 MED ORDER — ACETAMINOPHEN 10 MG/ML IV SOLN
INTRAVENOUS | Status: DC | PRN
  Administered 2017-06-10: 1000 mg via INTRAVENOUS

## 2017-06-10 MED ORDER — ONDANSETRON HCL 4 MG PO TABS: 4.0000 mg | ORAL_TABLET | Freq: Four times a day (QID) | ORAL | Status: DC | PRN

## 2017-06-10 MED ORDER — PROMETHAZINE HCL 25 MG/ML IJ SOLN
INTRAMUSCULAR | Status: AC
  Administered 2017-06-10: 6.25 mg via INTRAVENOUS
  Filled 2017-06-10: qty 1

## 2017-06-10 MED ORDER — LACTATED RINGERS IV SOLN
INTRAVENOUS | Status: DC
  Administered 2017-06-10: 09:00:00 via INTRAVENOUS

## 2017-06-10 MED ORDER — SODIUM CHLORIDE FLUSH 0.9 % IV SOLN
INTRAVENOUS | Status: AC
  Administered 2017-06-10: 20:00:00
  Filled 2017-06-10: qty 10

## 2017-06-10 MED ORDER — SODIUM CHLORIDE 0.9 % IV SOLN
INTRAVENOUS | Status: DC
  Administered 2017-06-10: 16:00:00 via INTRAVENOUS

## 2017-06-10 MED ORDER — PHENOL 1.4 % MT LIQD
1.0000 | OROMUCOSAL | Status: DC | PRN
  Filled 2017-06-10: qty 177

## 2017-06-10 MED ORDER — OXYCODONE HCL 5 MG PO TABS
5.0000 mg | ORAL_TABLET | ORAL | Status: DC | PRN
  Administered 2017-06-10 – 2017-06-11 (×5): 5 mg via ORAL
  Administered 2017-06-11 – 2017-06-12 (×2): 10 mg via ORAL
  Filled 2017-06-10: qty 2
  Filled 2017-06-10 (×3): qty 1
  Filled 2017-06-10 (×2): qty 2
  Filled 2017-06-10 (×2): qty 1

## 2017-06-10 MED ORDER — LIDOCAINE HCL (PF) 2 % IJ SOLN
INTRAMUSCULAR | Status: AC
  Filled 2017-06-10: qty 10

## 2017-06-10 MED ORDER — MAGNESIUM CITRATE PO SOLN
1.0000 | Freq: Once | ORAL | Status: DC | PRN
  Filled 2017-06-10 (×2): qty 296

## 2017-06-10 MED ORDER — DOCUSATE SODIUM 100 MG PO CAPS
100.0000 mg | ORAL_CAPSULE | Freq: Two times a day (BID) | ORAL | Status: DC
  Administered 2017-06-10 – 2017-06-12 (×4): 100 mg via ORAL
  Filled 2017-06-10 (×4): qty 1

## 2017-06-10 MED ORDER — METOCLOPRAMIDE HCL 10 MG PO TABS
5.0000 mg | ORAL_TABLET | Freq: Three times a day (TID) | ORAL | Status: DC | PRN
  Filled 2017-06-10: qty 1

## 2017-06-10 MED ORDER — PROPOFOL 500 MG/50ML IV EMUL
INTRAVENOUS | Status: AC
  Filled 2017-06-10: qty 50

## 2017-06-10 MED ORDER — FENTANYL CITRATE (PF) 100 MCG/2ML IJ SOLN: 25.0000 ug | INTRAMUSCULAR | Status: DC | PRN

## 2017-06-10 MED ORDER — MORPHINE SULFATE 10 MG/ML IJ SOLN
INTRAMUSCULAR | Status: DC | PRN
  Administered 2017-06-10: 10 mg via SUBCUTANEOUS

## 2017-06-10 MED ORDER — ENOXAPARIN SODIUM 30 MG/0.3ML ~~LOC~~ SOLN
30.0000 mg | Freq: Two times a day (BID) | SUBCUTANEOUS | Status: DC
  Administered 2017-06-11 – 2017-06-12 (×3): 30 mg via SUBCUTANEOUS
  Filled 2017-06-10 (×3): qty 0.3

## 2017-06-10 MED ORDER — MIDAZOLAM HCL 5 MG/5ML IJ SOLN
INTRAMUSCULAR | Status: DC | PRN
  Administered 2017-06-10 (×2): 1 mg via INTRAVENOUS

## 2017-06-10 MED ORDER — DIPHENHYDRAMINE HCL 12.5 MG/5ML PO ELIX
12.5000 mg | ORAL_SOLUTION | ORAL | Status: DC | PRN
  Filled 2017-06-10: qty 10

## 2017-06-10 MED ORDER — CEFAZOLIN SODIUM-DEXTROSE 2-4 GM/100ML-% IV SOLN
2.0000 g | Freq: Four times a day (QID) | INTRAVENOUS | Status: AC
  Administered 2017-06-10 (×2): 2 g via INTRAVENOUS
  Filled 2017-06-10 (×2): qty 100

## 2017-06-10 MED ORDER — PHENYLEPHRINE HCL 10 MG/ML IJ SOLN
INTRAMUSCULAR | Status: DC | PRN
  Administered 2017-06-10 (×3): 100 ug via INTRAVENOUS

## 2017-06-10 MED ORDER — CEFAZOLIN SODIUM-DEXTROSE 2-3 GM-%(50ML) IV SOLR
INTRAVENOUS | Status: AC
  Filled 2017-06-10: qty 50

## 2017-06-10 MED ORDER — FAMOTIDINE 20 MG PO TABS
ORAL_TABLET | ORAL | Status: AC
  Filled 2017-06-10: qty 1

## 2017-06-10 MED ORDER — ACETAMINOPHEN 325 MG PO TABS
325.0000 mg | ORAL_TABLET | Freq: Four times a day (QID) | ORAL | Status: DC | PRN
  Administered 2017-06-11: 650 mg via ORAL
  Filled 2017-06-10: qty 2

## 2017-06-10 MED ORDER — ONDANSETRON HCL 4 MG/2ML IJ SOLN
4.0000 mg | Freq: Four times a day (QID) | INTRAMUSCULAR | Status: DC | PRN
  Administered 2017-06-11: 4 mg via INTRAVENOUS
  Filled 2017-06-10: qty 2

## 2017-06-10 SURGICAL SUPPLY — 67 items
BANDAGE ACE 6X5 VEL STRL LF (GAUZE/BANDAGES/DRESSINGS) ×3 IMPLANT
BLADE SAW 1 (BLADE) ×3 IMPLANT
BLADE SAW SGTL 13X75X1.27 (BLADE) ×3 IMPLANT
BLOCK CUTTING FEMUR 3+ LT MED (MISCELLANEOUS) ×3 IMPLANT
BLOCK CUTTING TIBIAL 3 LT (MISCELLANEOUS) ×3 IMPLANT
CANISTER SUCT 1200ML W/VALVE (MISCELLANEOUS) ×3 IMPLANT
CANISTER SUCT 3000ML PPV (MISCELLANEOUS) ×6 IMPLANT
CEMENT FEMORAL COMP SZ3P LEFT (Femur) ×3 IMPLANT
CEMENT HV SMART SET (Cement) ×6 IMPLANT
CHLORAPREP W/TINT 26ML (MISCELLANEOUS) ×6 IMPLANT
COOLER POLAR GLACIER W/PUMP (MISCELLANEOUS) ×3 IMPLANT
CUFF TOURN 24 STER (MISCELLANEOUS) IMPLANT
CUFF TOURN 30 STER DUAL PORT (MISCELLANEOUS) IMPLANT
DRAPE SHEET LG 3/4 BI-LAMINATE (DRAPES) ×6 IMPLANT
ELECT CAUTERY BLADE 6.4 (BLADE) ×3 IMPLANT
ELECT REM PT RETURN 9FT ADLT (ELECTROSURGICAL) ×3
ELECTRODE REM PT RTRN 9FT ADLT (ELECTROSURGICAL) ×1 IMPLANT
FEMUR BONE MODEL 4.9010 MEDACT (MISCELLANEOUS) ×3 IMPLANT
GAUZE PETRO XEROFOAM 1X8 (MISCELLANEOUS) ×3 IMPLANT
GAUZE SPONGE 4X4 12PLY STRL (GAUZE/BANDAGES/DRESSINGS) ×3 IMPLANT
GLOVE BIOGEL PI IND STRL 9 (GLOVE) ×1 IMPLANT
GLOVE BIOGEL PI INDICATOR 9 (GLOVE) ×2
GLOVE INDICATOR 8.0 STRL GRN (GLOVE) ×3 IMPLANT
GLOVE SURG ORTHO 8.0 STRL STRW (GLOVE) ×3 IMPLANT
GLOVE SURG SYN 9.0  PF PI (GLOVE) ×2
GLOVE SURG SYN 9.0 PF PI (GLOVE) ×1 IMPLANT
GOWN SRG 2XL LVL 4 RGLN SLV (GOWNS) ×1 IMPLANT
GOWN STRL NON-REIN 2XL LVL4 (GOWNS) ×3
GOWN STRL REUS W/ TWL LRG LVL3 (GOWN DISPOSABLE) ×1 IMPLANT
GOWN STRL REUS W/ TWL XL LVL3 (GOWN DISPOSABLE) ×1 IMPLANT
GOWN STRL REUS W/TWL LRG LVL3 (GOWN DISPOSABLE) ×3
GOWN STRL REUS W/TWL XL LVL3 (GOWN DISPOSABLE) ×3
HOLDER FOLEY CATH W/STRAP (MISCELLANEOUS) ×3 IMPLANT
HOOD PEEL AWAY FLYTE STAYCOOL (MISCELLANEOUS) ×6 IMPLANT
IMMBOLIZER KNEE 19 BLUE UNIV (SOFTGOODS) ×3 IMPLANT
INSERT TIBIAL SZ3 LT 14 FLEX (Insert) ×3 IMPLANT
KIT PREVENA INCISION MGT20CM45 (CANNISTER) ×3 IMPLANT
KIT TURNOVER KIT A (KITS) ×3 IMPLANT
KNIFE SCULPS 14X20 (INSTRUMENTS) ×3 IMPLANT
NDL SAFETY ECLIPSE 18X1.5 (NEEDLE) ×1 IMPLANT
NEEDLE HYPO 18GX1.5 SHARP (NEEDLE) ×3
NEEDLE SPNL 18GX3.5 QUINCKE PK (NEEDLE) ×3 IMPLANT
NEEDLE SPNL 20GX3.5 QUINCKE YW (NEEDLE) ×3 IMPLANT
NS IRRIG 1000ML POUR BTL (IV SOLUTION) ×3 IMPLANT
PACK TOTAL KNEE (MISCELLANEOUS) ×3 IMPLANT
PAD WRAPON POLAR KNEE (MISCELLANEOUS) ×1 IMPLANT
PULSAVAC PLUS IRRIG FAN TIP (DISPOSABLE) ×3
SOL .9 NS 3000ML IRR  AL (IV SOLUTION) ×2
SOL .9 NS 3000ML IRR AL (IV SOLUTION) ×1
SOL .9 NS 3000ML IRR UROMATIC (IV SOLUTION) ×1 IMPLANT
STAPLER SKIN PROX 35W (STAPLE) ×3 IMPLANT
STEM EXTENSION 11MMX30MM (Stem) ×3 IMPLANT
SUCTION FRAZIER HANDLE 10FR (MISCELLANEOUS) ×2
SUCTION TUBE FRAZIER 10FR DISP (MISCELLANEOUS) ×1 IMPLANT
SUT DVC 2 QUILL PDO  T11 36X36 (SUTURE) ×2
SUT DVC 2 QUILL PDO T11 36X36 (SUTURE) ×1 IMPLANT
SUT V-LOC 90 ABS DVC 3-0 CL (SUTURE) ×3 IMPLANT
SYR 20CC LL (SYRINGE) ×3 IMPLANT
SYR 50ML LL SCALE MARK (SYRINGE) ×6 IMPLANT
TIB TRAY FIXED SZ6 LEFT (Joint) ×3 IMPLANT
TIBIAL BONE MODEL LEFT (MISCELLANEOUS) ×3 IMPLANT
TIP FAN IRRIG PULSAVAC PLUS (DISPOSABLE) ×1 IMPLANT
TOWEL OR 17X26 4PK STRL BLUE (TOWEL DISPOSABLE) ×3 IMPLANT
TOWER CARTRIDGE SMART MIX (DISPOSABLE) ×3 IMPLANT
TRAY FOLEY W/METER SILVER 16FR (SET/KITS/TRAYS/PACK) ×3 IMPLANT
WND VAC CANISTER 500ML (MISCELLANEOUS) ×3 IMPLANT
WRAPON POLAR PAD KNEE (MISCELLANEOUS) ×3

## 2017-06-10 NOTE — Anesthesia Procedure Notes (Signed)
Spinal  Patient location during procedure: OR Start time: 06/10/2017 10:20 AM End time: 06/10/2017 10:22 AM Staffing Anesthesiologist: Martha Clan, MD Resident/CRNA: Marsh Dolly, CRNA Performed: resident/CRNA  Preanesthetic Checklist Completed: patient identified, site marked, surgical consent, pre-op evaluation, timeout performed, IV checked, risks and benefits discussed and monitors and equipment checked Spinal Block Patient position: sitting Prep: ChloraPrep Patient monitoring: heart rate, continuous pulse ox, blood pressure and cardiac monitor Approach: midline Location: L3-4 Injection technique: single-shot Needle Needle type: Whitacre and Introducer  Needle gauge: 24 G Needle length: 9 cm Assessment Sensory level: T10 Additional Notes Negative paresthesia. Negative blood return. Positive free-flowing CSF. Expiration date of kit checked and confirmed. Patient tolerated procedure well, without complications.

## 2017-06-10 NOTE — Anesthesia Preprocedure Evaluation (Signed)
Anesthesia Evaluation  Patient identified by MRN, date of birth, ID band Patient awake    Reviewed: Allergy & Precautions, H&P , NPO status , Patient's Chart, lab work & pertinent test results, reviewed documented beta blocker date and time   History of Anesthesia Complications Negative for: history of anesthetic complications  Airway Mallampati: I  TM Distance: >3 FB Neck ROM: full    Dental  (+) Caps, Dental Advidsory Given, Missing, Teeth Intact   Pulmonary neg pulmonary ROS,           Cardiovascular Exercise Tolerance: Good hypertension, (-) angina(-) CAD, (-) Past MI, (-) Cardiac Stents and (-) CABG (-) dysrhythmias (-) Valvular Problems/Murmurs     Neuro/Psych negative neurological ROS  negative psych ROS   GI/Hepatic Neg liver ROS, hiatal hernia, PUD, GERD  ,  Endo/Other  negative endocrine ROS  Renal/GU negative Renal ROS  negative genitourinary   Musculoskeletal   Abdominal   Peds  Hematology negative hematology ROS (+)   Anesthesia Other Findings Past Medical History: No date: Adiposity No date: 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[place]) No date: Family history of adverse reaction to anesthesia     Comment:  mother hallucinated No date: GERD (gastroesophageal reflux disease) No date: History of hiatal hernia No date: Hyperlipidemia No date: Hypertension No date: Osteopenia No date: Ulcerative colitis ([place])   Reproductive/Obstetrics negative OB ROS                             Anesthesia Physical Anesthesia Plan  ASA: II  Anesthesia Plan: Spinal   Post-op Pain Management:    Induction: Intravenous  PONV Risk Score and Plan: 2 and Ondansetron and Dexamethasone  Airway Management Planned: Simple Face Mask  Additional Equipment:   Intra-op Plan:   Post-operative Plan:   Informed Consent: I have reviewed the patients History and Physical, chart, labs and discussed the procedure  including the risks, benefits and alternatives for the proposed anesthesia with the patient or authorized representative who has indicated his/her understanding and acceptance.   Dental Advisory Given  Plan Discussed with: Anesthesiologist, CRNA and Surgeon  Anesthesia Plan Comments:         Anesthesia Quick Evaluation

## 2017-06-10 NOTE — Op Note (Signed)
06/10/2017  12:08 PM  PATIENT:  Jamie Reid  67 y.o. female  PRE-OPERATIVE DIAGNOSIS:  PRIMARY OSTEOARTHRITIS OF LEFT KNEE  POST-OPERATIVE DIAGNOSIS:  PRIMARY OSTEOARTHRITIS OF LEFT KNEE  PROCEDURE:  Procedure(s): TOTAL KNEE ARTHROPLASTY (Left)  SURGEON: Laurene Footman, MD  ASSISTANTS: Rachelle Hora PA-C  ANESTHESIA:   spinal  EBL:  Total I/O In: 700 [I.V.:700] Out: 325 [Urine:250; Blood:75]  BLOOD ADMINISTERED:none  DRAINS: none   LOCAL MEDICATIONS USED:  MARCAINE    and OTHER Exparel and morphine  SPECIMEN:  No Specimen  DISPOSITION OF SPECIMEN:  N/A  COUNTS:  YES  TOURNIQUET:   Total Tourniquet Time Documented: Thigh (Left) - 44 minutes Total: Thigh (Left) - 44 minutes   IMPLANTS: Medacta 3+ left femur, 3 left tibia baseplate with 14 mm insert and short stem with 2 patella all components cemented  DICTATION: .Dragon Dictation  patient brought the operating room and after adequate spinal anesthesia was obtained left leg was prepped and draped in sterile fashion. After patient identification and timeout procedures were completed tourniquet was not raised until bone cuts made and bone bleeding required it and midline skin incision was made followed by medial parapatellar arthrotomy. The knee revealed extensive medialcompartment degenerative changes with somesynovitis. The fat pad and anterior cruciate ligament and PCL were excised and the proximal tibia cutting guide was applied to the proximal tibia and proximaltibia cut carried out followed by distal femur cut. The 4-in-1 cutting guide was applied to the femur anterior posterior and chamfer cuts made. The posterior horns of the menisci could be resected this time and the size 3tibia baseplate trial was placed in apporpriote rotationand proximal reaming carried out for short stem. The keel punch was placed followed by the 3+ femoral trial. Following this the trials were placed and the 72m insert gave good stability  through range of motion. This was thenchosen as the final implant. Distal femur drill holes were made followed by the trochlear groove cut and then trials were removed. The patella was affected with arthritis as welland resection was carried out drilling plate carried out and then a size 4patella fit well. These trials were all removed Thelocal anesthetic noted above was infiltrated in the para-articular tissue. The bony surfaces thoroughly irrigated and dried. Tibial component was cemented in place first followed by the plastic insert and then the femoral component and the knee placed in extension with excess cement being removed. Next the patellar button was clamped into place and after the cement entirely set the clamp was removed the patella tracked well with the tourniquet down the knee was thoroughly irrigated and then closed with a heavy Quill with 3-0 locking suture followed by skin staples. Xeroform 4 x 4's web roll ABDs Polar Care and Ace wrap applied     PLAN OF CARE: Admit to inpatient   PATIENT DISPOSITION:  PACU - hemodynamically stable.

## 2017-06-10 NOTE — Progress Notes (Signed)
Pt c/o mid chest heaviness. Pt sat up and pt c/o that chest discomfort was still there. Dr. Rosey Bath notified. Acknowledged. Orders received. Jamie Reid E 12:20 PM 06/10/2017

## 2017-06-10 NOTE — H&P (Signed)
Reviewed paper H+P, will be scanned into chart. No changes noted.  

## 2017-06-10 NOTE — Evaluation (Signed)
Physical Therapy Evaluation Patient Details Name: Jamie Reid MRN: [id number] DOB: 03/19/1950 Today's Date: 06/10/2017   History of Present Illness  67 y/o female s/p L TKA 06/10/17.  Clinical Impression  Pt did very well with POD0 PT exam. She was easily able to do SLRs (even against light resist), showed good mobility with supine to sit and sit to stand w/o direct physical assist, she was able to ambulate ~30 ft w/o excessive UE use of walker and showed good WBing tolerance, c/o only minimal pain t/o the session, she had >90 AROM flexion but is missing a few degrees of knee flexion.  Overall pt did very well with PT exam and 15 minutes of exercises and 10 minutes of mobiltiy/ambulation apart from the exam.  Expect pt to be able to return home (with HHPT) on discharge w/o issue.     Follow Up Recommendations Home health PT    Equipment Recommendations  (has RW, may need BSC)    Recommendations for Other Services       Precautions / Restrictions Precautions Precautions: Fall;Knee Restrictions Weight Bearing Restrictions: Yes LLE Weight Bearing: Weight bearing as tolerated      Mobility  Bed Mobility Overal bed mobility: Modified Independent             General bed mobility comments: Pt able to get LEs to EOB w/o assist, minimal cuing  Transfers Overall transfer level: Modified independent Equipment used: Rolling walker (2 wheeled)             General transfer comment: Pt was able to rise w/o excessive UE use, good use of L LE for WBing  Ambulation/Gait Ambulation/Gait assistance: Min guard Ambulation Distance (Feet): 30 Feet Assistive device: Rolling walker (2 wheeled)       General Gait Details: Pt did well with ambulation in room POD0.  She showed good confidence with WBing on the L with minimal limp, no excessive use of the walker, minimal fatigue with the effort.  Some increased pain with WBing.  Stairs            Wheelchair Mobility     Modified Rankin (Stroke Patients Only)       Balance Overall balance assessment: Independent                                           Pertinent Vitals/Pain Pain Assessment: 0-10 Pain Score: 2  Pain Location: L knee, some increase with ROM tasks (flex and ext)    Home Living Family/patient expects to be discharged to:: Private residence Living Arrangements: Spouse/significant other Available Help at Discharge: Family Type of Home: House Home Access: Stairs to enter Entrance Stairs-Rails: Can reach both(R only at one enterance, b/l on the other) Entrance Stairs-Number of Steps: 2 Home Layout: Laundry or work area in basement;Able to live on main level with bedroom/bathroom Home Equipment: Environmental consultant - 2 wheels      Prior Function Level of Independence: Independent         Comments: Pt was able to be active, occasionally needed cane secondary to knee pain.  Works in yard, drive, Comptroller        Extremity/Trunk Assessment   Upper Extremity Assessment Upper Extremity Assessment: Overall WFL for tasks assessed    Lower Extremity Assessment Lower Extremity Assessment: Overall WFL for tasks assessed(expected post-op weakness, but good quad control,  SLRs)       Communication   Communication: No difficulties  Cognition Arousal/Alertness: Awake/alert Behavior During Therapy: WFL for tasks assessed/performed Overall Cognitive Status: Within Functional Limits for tasks assessed                                        General Comments      Exercises Total Joint Exercises Ankle Circles/Pumps: AROM;10 reps Quad Sets: Strengthening;10 reps Gluteal Sets: Strengthening;10 reps Heel Slides: Strengthening;5 reps Hip ABduction/ADduction: Strengthening;10 reps Straight Leg Raises: AROM;10 reps Knee Flexion: PROM;5 reps Goniometric ROM: 3-96 (91* AROM)   Assessment/Plan    PT Assessment Patient needs continued PT  services  PT Problem List Decreased strength       PT Treatment Interventions DME instruction;Stair training;Gait training;Therapeutic activities;Therapeutic exercise;Balance training;Functional mobility training;Neuromuscular re-education;Patient/family education    PT Goals (Current goals can be found in the Care Plan section)  Acute Rehab PT Goals Patient Stated Goal: go home PT Goal Formulation: With patient Time For Goal Achievement: 06/24/17 Potential to Achieve Goals: Good    Frequency Min 2X/week   Barriers to discharge        Co-evaluation               AM-PAC PT "6 Clicks" Daily Activity  Outcome Measure Difficulty turning over in bed (including adjusting bedclothes, sheets and blankets)?: None Difficulty moving from lying on back to sitting on the side of the bed? : None Difficulty sitting down on and standing up from a chair with arms (e.g., wheelchair, bedside commode, etc,.)?: None Help needed moving to and from a bed to chair (including a wheelchair)?: None Help needed walking in hospital room?: A Little Help needed climbing 3-5 steps with a railing? : A Little 6 Click Score: 22    End of Session Equipment Utilized During Treatment: Gait belt Activity Tolerance: Patient tolerated treatment well Patient left: with call bell/phone within reach;with chair alarm set;with family/visitor present Nurse Communication: Mobility status PT Visit Diagnosis: Muscle weakness (generalized) (M62.81);Difficulty in walking, not elsewhere classified (R26.2)    Time: 8648-4720 PT Time Calculation (min) (ACUTE ONLY): 35 min   Charges:   PT Evaluation $PT Eval Low Complexity: 1 Low PT Treatments $Gait Training: 8-22 mins $Therapeutic Exercise: 8-22 mins   PT G Codes:        Kreg Shropshire, DPT 06/10/2017, 5:16 PM

## 2017-06-10 NOTE — Clinical Social Work Note (Signed)
CSW received referral for SNF.  Case discussed with case manager and plan is to discharge home with home health.  CSW to sign off please re-consult if social work needs arise.  Naftula Donahue R. Leilanny Fluitt, MSW, LCSWA [phone]  

## 2017-06-10 NOTE — Transfer of Care (Signed)
Immediate Anesthesia Transfer of Care Note  Patient: Jamie Reid  Procedure(s) Performed: TOTAL KNEE ARTHROPLASTY (Left )  Patient Location: PACU  Anesthesia Type:Spinal  Level of Consciousness: awake, alert  and oriented  Airway & Oxygen Therapy: Patient Spontanous Breathing  Post-op Assessment: Report given to RN and Post -op Vital signs reviewed and stable  Post vital signs: Reviewed and stable  Last Vitals:  Vitals Value Taken Time  BP 106/61 06/10/2017 12:08 PM  Temp    Pulse 93 06/10/2017 12:09 PM  Resp    SpO2 94 % 06/10/2017 12:09 PM  Vitals shown include unvalidated device data.  Last Pain:  Vitals:   06/10/17 0900  TempSrc: Oral  PainSc: 0-No pain         Complications: No apparent anesthesia complications

## 2017-06-10 NOTE — Anesthesia Post-op Follow-up Note (Signed)
Anesthesia QCDR form completed.        

## 2017-06-10 NOTE — Progress Notes (Signed)
Pt spinal progressing down. Pt came in at a T11 and now at an L4 and able to move her knees back and forth. Dr. Rosey Bath notified and ok'd pt to go to her room. Matison Nuccio E 1:48 PM 06/10/2017

## 2017-06-11 ENCOUNTER — Encounter: Payer: Self-pay | Admitting: Orthopedic Surgery

## 2017-06-11 LAB — BASIC METABOLIC PANEL
ANION GAP: 6 (ref 5–15)
BUN: 10 mg/dL (ref 6–20)
CALCIUM: 8.6 mg/dL — AB (ref 8.9–10.3)
CHLORIDE: 101 mmol/L (ref 101–111)
CO2: 27 mmol/L (ref 22–32)
Creatinine, Ser: 0.57 mg/dL (ref 0.44–1.00)
GFR calc Af Amer: >60 mL/min (ref >60)
GFR calc non Af Amer: >60 mL/min (ref >60)
Glucose, Bld: 119 mg/dL — ABNORMAL HIGH (ref 65–99)
Potassium: 4.1 mmol/L (ref 3.5–5.1)
Sodium: 134 mmol/L — ABNORMAL LOW (ref 135–145)

## 2017-06-11 LAB — CBC
HEMATOCRIT: 38.2 % (ref 35.0–47.0)
Hemoglobin: 12.6 g/dL (ref 12.0–16.0)
MCH: 29.5 pg (ref 26.0–34.0)
MCHC: 33 g/dL (ref 32.0–36.0)
MCV: 89.3 fL (ref 80.0–100.0)
Platelets: 218 10*3/uL (ref 150–440)
RBC: 4.28 MIL/uL (ref 3.80–5.20)
RDW: 14.1 % (ref 11.5–14.5)
WBC: 10.2 10*3/uL (ref 3.6–11.0)

## 2017-06-11 MED ORDER — ENOXAPARIN SODIUM 40 MG/0.4ML ~~LOC~~ SOLN: 40.0000 mg | SUBCUTANEOUS | 0 refills | Status: DC

## 2017-06-11 MED ORDER — DOCUSATE SODIUM 100 MG PO CAPS: 100.0000 mg | ORAL_CAPSULE | Freq: Two times a day (BID) | ORAL | 0 refills | Status: DC

## 2017-06-11 MED ORDER — OXYCODONE HCL 5 MG PO TABS: 5.0000 mg | ORAL_TABLET | ORAL | 0 refills | Status: DC | PRN

## 2017-06-11 MED ORDER — METHOCARBAMOL 500 MG PO TABS: 500.0000 mg | ORAL_TABLET | Freq: Four times a day (QID) | ORAL | 0 refills | Status: DC | PRN

## 2017-06-11 MED ORDER — MAGNESIUM HYDROXIDE 400 MG/5ML PO SUSP
30.0000 mL | Freq: Every day | ORAL | Status: DC
  Administered 2017-06-11 – 2017-06-12 (×2): 30 mL via ORAL
  Filled 2017-06-11 (×2): qty 30

## 2017-06-11 NOTE — Discharge Summary (Signed)
Physician Discharge Summary  Patient ID: Jamie Reid MRN: [id number] DOB/AGE: 1951/02/04 67 y.o.  Admit date: 06/10/2017 Discharge date: 06/12/2017  Admission Diagnoses:  PRIMARY OSTEOARTHRITIS OF LEFT KNEE   Discharge Diagnoses: Patient Active Problem List   Diagnosis Date Noted  . Status post total left knee replacement 06/10/2017  . Adrenal adenoma 10/07/2013  . Gastric catarrh 10/07/2013  . Hypercholesteremia 10/07/2013  . HLD (hyperlipidemia) 10/07/2013  . Adiposity 10/07/2013  . Osteopenia 10/07/2013  . 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[place]) 10/07/2013    Past Medical History:  Diagnosis Date  . Adiposity   . 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 Family history of adverse reaction to anesthesia    mother hallucinated  . GERD (gastroesophageal reflux disease)   . History of hiatal hernia   . Hyperlipidemia   . Hypertension   . Osteopenia   . Ulcerative colitis ([place])      Transfusion: none   Consultants (if any): Treatment Team:  Brendolyn Patty, MD  Discharged Condition: Improved  Hospital Course: Jamie Reid is an 67 y.o. female who was admitted 06/10/2017 with a diagnosis of left knee osteoarthritis and went to the operating room on 06/10/2017 and underwent the above named procedures.    Surgeries: Procedure(s): TOTAL KNEE ARTHROPLASTY on 06/10/2017 Patient tolerated the surgery well. Taken to PACU where she was stabilized and then transferred to the orthopedic floor.  Started on Lovenox 30 mg q 12 hrs. Foot pumps applied bilaterally at 80 mm. Heels elevated on bed with rolled towels. No evidence of DVT. Negative Homan. Physical therapy started on day #1 for gait training and transfer. OT started day #1 for ADL and assisted devices.  Patient's foley was d/c on day #1. Patient's IV  was d/c on day #2.  On post op day #2 patient was stable and ready for discharge to home with HHPT.  Implants:  Medacta 3+ left femur, 3 left tibia baseplate with 14 mm insert and short stem with 2 patella  all components cemented    She was given perioperative antibiotics:  Anti-infectives (From admission, onward)   Start     Dose/Rate Route Frequency Ordered Stop   06/10/17 1630  ceFAZolin (ANCEF) IVPB 2g/100 mL premix     2 g 200 mL/hr over 30 Minutes Intravenous Every 6 hours 06/10/17 1432 06/10/17 2309   06/10/17 0845  ceFAZolin (ANCEF) IVPB 2g/100 mL premix  Status:  Discontinued     2 g 200 mL/hr over 30 Minutes Intravenous  Once 06/10/17 0843 06/10/17 1428   06/10/17 0816  ceFAZolin (ANCEF) 2-3 GM-%(50ML) IVPB SOLR    Note to Pharmacy:  Jola Baptist   : cabinet override      06/10/17 0816 06/10/17 1030    .  She was given sequential compression devices, early ambulation, and Lovenox for DVT prophylaxis.  She benefited maximally from the hospital stay and there were no complications.    Recent vital signs:  Vitals:   06/11/17 1638 06/11/17 1822  BP: 138/71 (!) 120/56  Pulse: 82 90  Resp: 18 16  Temp: 98.1 F (36.7 C) 98.9 F (37.2 C)  SpO2: 100% 92%    Recent laboratory studies:  Lab Results  Component Value Date   HGB 12.6 06/11/2017   HGB 13.5 06/10/2017   HGB 14.7 05/28/2017   Lab Results  Component Value Date   WBC 10.2 06/11/2017   PLT 218 06/11/2017   Lab Results  Component Value Date   INR 0.93 05/28/2017   Lab Results  Component Value Date   NA 134 (L) 06/11/2017   K 4.1 06/11/2017   CL 101 06/11/2017   CO2 27 06/11/2017   BUN 10 06/11/2017   CREATININE 0.57 06/11/2017   GLUCOSE 119 (H) 06/11/2017    Discharge Medications:   Allergies as of 06/11/2017      Reactions   Calcium    Due to UC   Codeine Nausea Only   dizzy   Latex Hives   Nsaids Other (See Comments)   Aggravate ulcerative colitis   Sulfa Antibiotics Other (See Comments)   Unknown   Tramadol Nausea Only      Medication List    TAKE these medications   acetaminophen 500 MG tablet Commonly known as:  TYLENOL Take 500 mg by mouth every 4 (four) hours as needed for  moderate pain.   cholecalciferol 400 units Tabs tablet Commonly known as:  VITAMIN D Take 400 Units by mouth 2 (two) times daily.   diphenhydrAMINE 25 MG tablet Commonly known as:  BENADRYL Take 25 mg by mouth daily as needed for allergies.   docusate sodium 100 MG capsule Commonly known as:  COLACE Take 1 capsule (100 mg total) by mouth 2 (two) times daily. Start taking on:  06/12/2017   enoxaparin 40 MG/0.4ML injection Commonly known as:  LOVENOX Inject 0.4 mLs (40 mg total) into the skin daily for 14 days.   losartan 25 MG tablet Commonly known as:  COZAAR Take 25 mg by mouth daily.   Mesalamine 800 MG Tbec Take 800 mg by mouth 2 (two) times daily after a meal.   methocarbamol 500 MG tablet Commonly known as:  ROBAXIN Take 1 tablet (500 mg total) by mouth every 6 (six) hours as needed for muscle spasms.   oxyCODONE 5 MG immediate release tablet Commonly known as:  Oxy IR/ROXICODONE Take 1-2 tablets (5-10 mg total) by mouth every 4 (four) hours as needed for moderate pain (pain score 4-6).   pantoprazole 40 MG tablet Commonly known as:  PROTONIX Take 40 mg by mouth 2 (two) times daily.            Durable Medical Equipment  (From admission, onward)        Start     Ordered   06/10/17 1431  DME Walker rolling  Once    Question:  Patient needs a walker to treat with the following condition  Answer:  Status post total left knee replacement   06/10/17 1432   06/10/17 1431  DME 3 n 1  Once     06/10/17 1432   06/10/17 1431  DME Bedside commode  Once    Question:  Patient needs a bedside commode to treat with the following condition  Answer:  Status post total left knee replacement   06/10/17 1432      Diagnostic Studies: Dg Knee 1-2 Views Left  Result Date: 06/10/2017 CLINICAL DATA:  67 year old female with a history of total knee replacement EXAM: LEFT KNEE - 1-2 VIEW COMPARISON:  04/29/2017 FINDINGS: Early surgical changes of left knee arthroplasty with  surgical staples in the soft tissues, gas within the soft tissues, surgical drain in place. No acute fracture with alignment at the knee joint maintained. IMPRESSION: Early postsurgical changes of left knee arthroplasty. Electronically Signed   By: Corrie Mckusick D.O.   On: 06/10/2017 13:03    Disposition:     Follow-up Information    Hessie Knows, MD Follow up in 2 week(s).   Specialty:  Orthopedic Surgery Contact information: [place] [address] [phone]            Signed: Feliberto Gottron 06/11/2017, 9:47 PM

## 2017-06-11 NOTE — Progress Notes (Signed)
   Subjective: 1 Day Post-Op Procedure(s) (LRB): TOTAL KNEE ARTHROPLASTY (Left) Patient reports pain as 2 on 0-10 scale.   Patient is well, and has had no acute complaints or problems Denies any CP, SOB, ABD pain. We will continue therapy today.  Plan is to go Home after hospital stay.  Objective: Vital signs in last 24 hours: Temp:  [97.4 F (36.3 C)-98.6 F (37 C)] 97.8 F (36.6 C) (04/17 0430) Pulse Rate:  [58-92] 68 (04/17 0430) Resp:  [0-19] 19 (04/17 0430) BP: (104-161)/(60-78) 134/66 (04/17 0430) SpO2:  [95 %-100 %] 97 % (04/17 0430) Weight:  [89.8 kg (198 lb)] 89.8 kg (198 lb) (04/16 0900)  Intake/Output from previous day: 04/16 0701 - 04/17 0700 In: 1240 [P.O.:240; I.V.:1000] Out: 1015 [Urine:940; Blood:75] Intake/Output this shift: No intake/output data recorded.  Recent Labs    06/10/17 1449 06/11/17 0326  HGB 13.5 12.6   Recent Labs    06/10/17 1449 06/11/17 0326  WBC 14.5* 10.2  RBC 4.57 4.28  HCT 40.7 38.2  PLT 224 218   Recent Labs    06/10/17 1449 06/11/17 0326  NA  --  134*  K  --  4.1  CL  --  101  CO2  --  27  BUN  --  10  CREATININE 0.55 0.57  GLUCOSE  --  119*  CALCIUM  --  8.6*   No results for input(s): LABPT, INR in the last 72 hours.  EXAM General - Patient is Alert, Appropriate and Oriented Extremity - Neurovascular intact Sensation intact distally Intact pulses distally Dorsiflexion/Plantar flexion intact Incision: dressing C/D/I and wound vac intact with out drainage No cellulitis present Compartment soft Dressing - dressing C/D/I and no drainage Motor Function - intact, moving foot and toes well on exam.   Past Medical History:  Diagnosis Date  . Adiposity   . 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[place])   . Family history of adverse reaction to anesthesia    mother hallucinated  . GERD (gastroesophageal reflux disease)   . History of hiatal hernia   . Hyperlipidemia   . Hypertension   . Osteopenia   . Ulcerative colitis ([place])      Assessment/Plan:   1 Day Post-Op Procedure(s) (LRB): TOTAL KNEE ARTHROPLASTY (Left) Active Problems:   Status post total left knee replacement  Estimated body mass index is 35.07 kg/m as calculated from the following:   Height as of this encounter: 5' 3"  (1.6 m).   Weight as of this encounter: 89.8 kg (198 lb). Advance diet Up with therapy  Needs bowel movement Pain well controlled  vital signs are stable Labs are stable Care management to assist with discharge to home with home health PT Possible discharge home Thursday pending progress with PT   DVT Prophylaxis - Lovenox, Foot Pumps and TED hose Weight-Bearing as tolerated to left leg   T. Rachelle Hora, PA-C [place] 06/11/2017, 7:26 AM

## 2017-06-11 NOTE — Progress Notes (Signed)
Physical Therapy Treatment Patient Details Name: Jamie Reid MRN: [id number] DOB: 1950/04/10 Today's Date: 06/11/2017    History of Present Illness 67 y/o female s/p L TKA 06/10/17.    PT Comments    Patient with noted improvement in functional performance, tolerating increased activity, distance with less physical assist this date.  Completing full lap around nursing station with close sup; good mechanics and weight acceptance, knee control throughout all phases of gait/mobility.  Plan to initiate stair training this AM (has 2 with rails to enter/exit home; full flight to basement).    Follow Up Recommendations  Home health PT     Equipment Recommendations       Recommendations for Other Services       Precautions / Restrictions Precautions Precautions: Fall;Knee Restrictions Weight Bearing Restrictions: Yes LLE Weight Bearing: Weight bearing as tolerated    Mobility  Bed Mobility               General bed mobility comments: seated in recliner beginning/end of treatment session  Transfers Overall transfer level: Needs assistance Equipment used: Rolling walker (2 wheeled) Transfers: Sit to/from Stand Sit to Stand: Supervision         General transfer comment: min cuing/instruction for symmetrical LE foot placement, WBing as pain allows  Ambulation/Gait Ambulation/Gait assistance: Supervision Ambulation Distance (Feet): 220 Feet Assistive device: Rolling walker (2 wheeled)   Gait velocity: 10' walk time, 10 seconds   General Gait Details: reciprocal stepping pattern with fair/good cadence and overall gait speed (cuing to increase as tolerated); good L LE stance time/weight acceptance and knee control.   Stairs             Wheelchair Mobility    Modified Rankin (Stroke Patients Only)       Balance Overall balance assessment: Needs assistance Sitting-balance support: No upper extremity supported;Feet supported Sitting balance-Leahy Scale:  Good     Standing balance support: Bilateral upper extremity supported Standing balance-Leahy Scale: Fair                              Cognition Arousal/Alertness: Awake/alert Behavior During Therapy: WFL for tasks assessed/performed Overall Cognitive Status: Within Functional Limits for tasks assessed                                        Exercises Total Joint Exercises Goniometric ROM: L knee: 4-85 degrees, limited by pain/soreness Other Exercises Other Exercises: Seated LE therex, 1x15, AROM: ankle pumps, quad sets, LAQs, marching, self-assisted knee flexion.  Good active muscle strength/control    General Comments        Pertinent Vitals/Pain Pain Assessment: Faces Faces Pain Scale: Hurts a little bit Pain Location: L knee Pain Descriptors / Indicators: Aching;Guarding Pain Intervention(s): Monitored during session;Repositioned;Limited activity within patient's tolerance;Premedicated before session    Home Living                      Prior Function            PT Goals (current goals can now be found in the care plan section) Acute Rehab PT Goals Patient Stated Goal: go home PT Goal Formulation: With patient Time For Goal Achievement: 06/24/17 Potential to Achieve Goals: Good Progress towards PT goals: Progressing toward goals    Frequency    BID  PT Plan Current plan remains appropriate    Co-evaluation              AM-PAC PT "6 Clicks" Daily Activity  Outcome Measure  Difficulty turning over in bed (including adjusting bedclothes, sheets and blankets)?: None Difficulty moving from lying on back to sitting on the side of the bed? : None Difficulty sitting down on and standing up from a chair with arms (e.g., wheelchair, bedside commode, etc,.)?: None Help needed moving to and from a bed to chair (including a wheelchair)?: None Help needed walking in hospital room?: A Little Help needed climbing 3-5  steps with a railing? : A Little 6 Click Score: 22    End of Session Equipment Utilized During Treatment: Gait belt Activity Tolerance: Patient tolerated treatment well Patient left: in chair;with chair alarm set;with call bell/phone within reach Nurse Communication: Mobility status PT Visit Diagnosis: Muscle weakness (generalized) (M62.81);Difficulty in walking, not elsewhere classified (R26.2)     Time: 3601-6580 PT Time Calculation (min) (ACUTE ONLY): 26 min  Charges:  $Gait Training: 8-22 mins $Therapeutic Exercise: 8-22 mins                    G Codes:      [place] Romey H. Owens Shark, PT, DPT, NCS 06/11/17, 10:43 AM [phone]

## 2017-06-11 NOTE — Progress Notes (Signed)
Physical Therapy Treatment Patient Details Name: Jamie Reid MRN: [id number] DOB: 12-12-1950 Today's Date: 06/11/2017    History of Present Illness 67 y/o female s/p L TKA 06/10/17.    PT Comments    Mild increase in soreness to L knee this PM, but agreeable to gait and stairs as tolerated.   Good L knee stability in modified SLS noted with stair negotiation; min cuing for technique.   Improved safety/performance with use of bilat rails; recommended use of front stairs at home to take advantage of bilat rails-patient voiced understanding.   Follow Up Recommendations  Home health PT     Equipment Recommendations       Recommendations for Other Services       Precautions / Restrictions Precautions Precautions: Fall;Knee Restrictions Weight Bearing Restrictions: Yes LLE Weight Bearing: Weight bearing as tolerated    Mobility  Bed Mobility               General bed mobility comments: seated in recliner beginning/end of treatment session  Transfers Overall transfer level: Needs assistance Equipment used: Rolling walker (2 wheeled) Transfers: Sit to/from Stand Sit to Stand: Min assist         General transfer comment: posterior LOB with initial stand, min assist to correct/recover; close sup for additional reps  Ambulation/Gait Ambulation/Gait assistance: Supervision Ambulation Distance (Feet): 130 Feet(x2) Assistive device: Rolling walker (2 wheeled)       General Gait Details: slower and slightly more guarded this PM (due to increased pain); maintains partial knee flexion throughout gait cycle. Cuing for postural extension and cadence as tolerated.   Stairs Stairs: Yes Stairs assistance: Min assist;Min guard   Number of Stairs: 4(x2) General stair comments: up/down 4 with bilat rails, cga, good technique and stability; up/down 4 with bilat UEs on single (R ascending) rail, min assist.  Improved safety/performance with use of bilat rails; recommended use  of front stairs at home to take advantage of bilat rails-patient voiced understanding.   Wheelchair Mobility    Modified Rankin (Stroke Patients Only)       Balance Overall balance assessment: Needs assistance Sitting-balance support: No upper extremity supported;Feet supported Sitting balance-Leahy Scale: Good     Standing balance support: Bilateral upper extremity supported Standing balance-Leahy Scale: Fair                              Cognition Arousal/Alertness: Awake/alert Behavior During Therapy: WFL for tasks assessed/performed Overall Cognitive Status: Within Functional Limits for tasks assessed                                        Exercises Other Exercises Other Exercises: Verbally reviewed technique for car transfers; patient voiced understanding.    General Comments        Pertinent Vitals/Pain Pain Assessment: Faces Faces Pain Scale: Hurts little more Pain Location: L knee Pain Descriptors / Indicators: Aching;Guarding Pain Intervention(s): Limited activity within patient's tolerance;Monitored during session;Repositioned;Patient requesting pain meds-RN notified    Home Living                      Prior Function            PT Goals (current goals can now be found in the care plan section) Acute Rehab PT Goals Patient Stated Goal: go home PT Goal Formulation: With  patient Time For Goal Achievement: 06/24/17 Potential to Achieve Goals: Good Progress towards PT goals: Progressing toward goals    Frequency    BID      PT Plan Current plan remains appropriate    Co-evaluation              AM-PAC PT "6 Clicks" Daily Activity  Outcome Measure  Difficulty turning over in bed (including adjusting bedclothes, sheets and blankets)?: None Difficulty moving from lying on back to sitting on the side of the bed? : None Difficulty sitting down on and standing up from a chair with arms (e.g., wheelchair,  bedside commode, etc,.)?: None Help needed moving to and from a bed to chair (including a wheelchair)?: A Little Help needed walking in hospital room?: A Little Help needed climbing 3-5 steps with a railing? : A Little 6 Click Score: 21    End of Session Equipment Utilized During Treatment: Gait belt Activity Tolerance: Patient tolerated treatment well Patient left: in chair;with chair alarm set;with call bell/phone within reach;with family/visitor present Nurse Communication: Mobility status PT Visit Diagnosis: Muscle weakness (generalized) (M62.81);Difficulty in walking, not elsewhere classified (R26.2)     Time: 9494-4739 PT Time Calculation (min) (ACUTE ONLY): 24 min  Charges:  $Gait Training: 23-37 mins                    G Codes:       [place] Consiglio H. Owens Shark, PT, DPT, NCS 06/11/17, 4:41 PM [phone]

## 2017-06-11 NOTE — Evaluation (Signed)
Occupational Therapy Evaluation Patient Details Name: Jamie Reid MRN: [id number] DOB: Dec 25, 1950 Today's Date: 06/11/2017    History of Present Illness 67 y/o female s/p L TKA 06/10/17.   Clinical Impression   Pt seen for OT evaluation this date, POD#1 from above surgery. Pt was independent in all ADLs prior to surgery, however occasionally using SPC or walking stick due to L knee pain. Pt is eager to return to PLOF with less pain and improved safety and independence. Pt currently requires minimal assist for LB dressing while in seated position due to pain and limited AROM of L knee. Pt instructed in polar care mgt, falls prevention strategies, home/routines modifications, DME/AE for LB bathing and dressing tasks. Pt would benefit from additional instruction in dressing techniques with or without assistive devices for dressing and bathing skills to support recall and carryover prior to discharge. Good supports in place. Do not currently anticipate any OT needs following this hospitalization.      Follow Up Recommendations  No OT follow up    Equipment Recommendations  None recommended by OT    Recommendations for Other Services       Precautions / Restrictions Precautions Precautions: Fall;Knee Restrictions Weight Bearing Restrictions: Yes LLE Weight Bearing: Weight bearing as tolerated      Mobility Bed Mobility               General bed mobility comments: seated in recliner beginning/end of treatment session  Transfers Overall transfer level: Needs assistance Equipment used: Rolling walker (2 wheeled) Transfers: Sit to/from Stand Sit to Stand: Min guard         General transfer comment: posterior LOB with initial stand, min assist to correct/recover; close sup for additional reps    Balance Overall balance assessment: Needs assistance Sitting-balance support: No upper extremity supported;Feet supported Sitting balance-Leahy Scale: Good     Standing balance  support: Bilateral upper extremity supported Standing balance-Leahy Scale: Fair                             ADL either performed or assessed with clinical judgement   ADL Overall ADL's : Needs assistance/impaired             Lower Body Bathing: Sit to/from stand;Modified independent Lower Body Bathing Details (indicate cue type and reason): using LH sponge, pt could perform without assist, instructed to not take full shower until cleared by MD, seated sponge bath until then     Lower Body Dressing: Sit to/from stand;Minimal assistance Lower Body Dressing Details (indicate cue type and reason): instructed pt in compression stocking mgt and use of AE for LB dressing, pt verbalized understanding and expressed significant interest in implementing strategies at home Toilet Transfer: RW;Min guard           Functional mobility during ADLs: Min guard;Rolling walker       Vision Baseline Vision/History: Wears glasses Wears Glasses: At all times Patient Visual Report: No change from baseline       Perception     Praxis      Pertinent Vitals/Pain Pain Assessment: 0-10 Pain Score: 8  Faces Pain Scale: Hurts little more Pain Location: L knee, just received pain meds approx 5 minutes before OT session Pain Descriptors / Indicators: Aching;Guarding Pain Intervention(s): Limited activity within patient's tolerance;Repositioned;Ice applied;Monitored during session;Premedicated before session     Hand Dominance Right   Extremity/Trunk Assessment Upper Extremity Assessment Upper Extremity Assessment: Overall WFL for tasks  assessed   Lower Extremity Assessment Lower Extremity Assessment: Overall WFL for tasks assessed(expected post-op weakness, but good quad control, SLRs)   Cervical / Trunk Assessment Cervical / Trunk Assessment: Normal   Communication Communication Communication: No difficulties   Cognition Arousal/Alertness: Awake/alert Behavior During  Therapy: WFL for tasks assessed/performed Overall Cognitive Status: Within Functional Limits for tasks assessed                                     General Comments       Exercises Other Exercises Other Exercises: pt instructed in home/routines modifications and falls prevention strategies to maximize safety Other Exercises: pt instructed in polar care mgt    Shoulder Instructions      Home Living Family/patient expects to be discharged to:: Private residence Living Arrangements: Spouse/significant other Available Help at Discharge: Family Type of Home: House Home Access: Stairs to enter Technical brewer of Steps: 2 Entrance Stairs-Rails: Can reach both(R only at one enterance, b/l on the other) Home Layout: Laundry or work area in basement;Able to live on main level with bedroom/bathroom     Bathroom Shower/Tub: Gaffer;Tub/shower unit   Bathroom Toilet: Standard     Home Equipment: Environmental consultant - 2 wheels;Shower seat;Cane - single point;Other (comment);Adaptive equipment(walking sticks) Adaptive Equipment: Reacher;Long-handled shoe horn        Prior Functioning/Environment Level of Independence: Independent        Comments: Pt was able to be active, occasionally needed cane secondary to knee pain.  Works in yard, drive, Social research officer, government.        OT Problem List: Decreased strength;Decreased knowledge of use of DME or AE;Pain;Impaired balance (sitting and/or standing);Decreased activity tolerance;Decreased range of motion      OT Treatment/Interventions: Self-care/ADL training;Balance training;Therapeutic exercise;Therapeutic activities;Patient/family education;DME and/or AE instruction    OT Goals(Current goals can be found in the care plan section) Acute Rehab OT Goals Patient Stated Goal: go home and ultimately return to PLOF with less pain in L knee OT Goal Formulation: With patient Time For Goal Achievement: 06/18/17 Potential to Achieve Goals:  Good ADL Goals Pt Will Perform Lower Body Dressing: with modified independence;sit to/from stand;with adaptive equipment  OT Frequency: Min 1X/week   Barriers to D/C:            Co-evaluation              AM-PAC PT "6 Clicks" Daily Activity     Outcome Measure Help from another person eating meals?: None Help from another person taking care of personal grooming?: None Help from another person toileting, which includes using toliet, bedpan, or urinal?: A Little Help from another person bathing (including washing, rinsing, drying)?: A Little Help from another person to put on and taking off regular upper body clothing?: None Help from another person to put on and taking off regular lower body clothing?: A Little 6 Click Score: 21   End of Session    Activity Tolerance: Patient tolerated treatment well Patient left: in chair;with call bell/phone within reach;with chair alarm set;Other (comment)(polar care in place, rolled towel under L ankle)  OT Visit Diagnosis: Other abnormalities of gait and mobility (R26.89);Pain Pain - Right/Left: Left Pain - part of body: Knee                Time: 1640-1706 OT Time Calculation (min): 26 min Charges:  OT General Charges $OT Visit: 1 Visit OT Evaluation $OT  Eval Low Complexity: 1 Low OT Treatments $Self Care/Home Management : 8-22 mins  Jeni Salles, MPH, MS, OTR/L ascom [phone] 06/11/17, 5:26 PM

## 2017-06-11 NOTE — Discharge Instructions (Signed)

## 2017-06-11 NOTE — Anesthesia Postprocedure Evaluation (Signed)
Anesthesia Post Note  Patient: Jamie Reid  Procedure(s) Performed: TOTAL KNEE ARTHROPLASTY (Left )  Patient location during evaluation: Nursing Unit Anesthesia Type: Spinal Level of consciousness: awake, awake and alert and oriented Pain management: pain level controlled Vital Signs Assessment: post-procedure vital signs reviewed and stable Respiratory status: spontaneous breathing, nonlabored ventilation and respiratory function stable Cardiovascular status: blood pressure returned to baseline and stable Postop Assessment: no backache and no headache Anesthetic complications: no     Last Vitals:  Vitals:   06/10/17 2331 06/11/17 0430  BP: 128/66 134/66  Pulse: 65 68  Resp: 19 19  Temp: 37 C 36.6 C  SpO2: 99% 97%    Last Pain:  Vitals:   06/11/17 0430  TempSrc: Oral  PainSc:                  Johnna Acosta

## 2017-06-11 NOTE — Care Management Note (Signed)
Case Management Note  Patient Details  Name: Jamie Reid MRN: [id number] Date of Birth: 28-Dec-1950  Subjective/Objective:  POD # 1 left total knee arthoplasty. Met with patient and her spouse at bedside to discuss discharge plan. It is anticipated patient will discharge tomorrow. She has a walker and bsc. Offered choice of home health. [facility] due to insurance. [phone]. Called Lovenox 40 mg # 14 no refills per Dr. Rudene Christians.                  Action/Plan: [facility] for HHPT, Lovenox called in  Expected Discharge Date:                  Expected Discharge Plan:  [place]  In-House Referral:     Discharge planning Services  CM Consult  Post Acute Care Choice:  Home Health Choice offered to:  Patient  DME Arranged:    DME Agency:     HH Arranged:  PT [place]:  [place]  Status of Service:  In process, will continue to follow  If discussed at Long Length of Stay Meetings, dates discussed:    Additional Comments:  Jolly Mango, RN 06/11/2017, 2:27 PM

## 2017-06-12 MED ORDER — FLEET ENEMA 7-19 GM/118ML RE ENEM
1.0000 | ENEMA | Freq: Once | RECTAL | Status: AC
  Administered 2017-06-12: 1 via RECTAL

## 2017-06-12 NOTE — Progress Notes (Signed)
Occupational Therapy Treatment Patient Details Name: Jamie Reid MRN: [id number] DOB: 1950/05/08 Today's Date: 06/12/2017    History of present illness 67 y/o female s/p L TKA 06/10/17.   OT comments  Pt seen for OT tx this am. Pt seated in recliner after PT session, recently received pain medication and medication to address constipation (pt pending BM in order to go home). OT reviewed instructions for compression stockings, polar care mgt, AE for LB dressing, seated sponge bath and eventual return to showering, and falls prevention. Pt verbalized understanding of all education/training provided. Pt progressing well towards goals. Will continue to progress while in the hospital.   Follow Up Recommendations  No OT follow up    Equipment Recommendations  None recommended by OT    Recommendations for Other Services      Precautions / Restrictions Precautions Precautions: Fall;Knee Restrictions Weight Bearing Restrictions: Yes LLE Weight Bearing: Weight bearing as tolerated       Mobility Bed Mobility               Transfers           Balance                           ADL either performed or assessed with clinical judgement   ADL Overall ADL's : Needs assistance/impaired                                       General ADL Comments: Pt required min assist for LB ADL tasks due to decreased ROM/strength and pain in L knee.      Vision Baseline Vision/History: Wears glasses Wears Glasses: At all times Patient Visual Report: No change from baseline     Perception     Praxis      Cognition Arousal/Alertness: Awake/alert Behavior During Therapy: WFL for tasks assessed/performed Overall Cognitive Status: Within Functional Limits for tasks assessed                                          Exercises Other Exercises Other Exercises: OT reviewed instructions for compression stockings, polar care mgt, AE for LB  dressing, seated sponge bath and eventual return to showering, and falls prevention. Pt verbalized understanding of all education/training provided.    Shoulder Instructions       General Comments      Pertinent Vitals/ Pain       Pain Assessment: 0-10 Pain Score: 4  Pain Location: L knee  Pain Descriptors / Indicators: Aching;Guarding Pain Intervention(s): Limited activity within patient's tolerance;Monitored during session;Ice applied;Premedicated before session  Home Living                                          Prior Functioning/Environment              Frequency  Min 1X/week        Progress Toward Goals  OT Goals(current goals can now be found in the care plan section)  Progress towards OT goals: Progressing toward goals  Acute Rehab OT Goals Patient Stated Goal: go home and ultimately return to PLOF with less pain in L knee OT  Goal Formulation: With patient Time For Goal Achievement: 06/18/17 Potential to Achieve Goals: Good  Plan Discharge plan remains appropriate;Frequency remains appropriate    Co-evaluation                 AM-PAC PT "6 Clicks" Daily Activity     Outcome Measure   Help from another person eating meals?: None Help from another person taking care of personal grooming?: None Help from another person toileting, which includes using toliet, bedpan, or urinal?: A Little Help from another person bathing (including washing, rinsing, drying)?: A Little Help from another person to put on and taking off regular upper body clothing?: None Help from another person to put on and taking off regular lower body clothing?: A Little 6 Click Score: 21    End of Session    OT Visit Diagnosis: Other abnormalities of gait and mobility (R26.89);Pain Pain - Right/Left: Left Pain - part of body: Knee   Activity Tolerance Patient tolerated treatment well   Patient Left in chair;with call bell/phone within reach;with chair  alarm set;Other (comment)(rolled towel under B ankles, polar care in place)   Nurse Communication          Time: 4171-2787 OT Time Calculation (min): 11 min  Charges: OT General Charges $OT Visit: 1 Visit OT Treatments $Self Care/Home Management : 8-22 mins   Jeni Salles, MPH, MS, OTR/L ascom [phone] 06/12/17, 11:17 AM

## 2017-06-12 NOTE — Progress Notes (Signed)
   Subjective: 2 Days Post-Op Procedure(s) (LRB): TOTAL KNEE ARTHROPLASTY (Left) Patient reports pain as mild.   Patient is well, and has had no acute complaints or problems Denies any CP, SOB, ABD pain. We will continue therapy today.  Plan is to go Home after hospital stay.  Objective: Vital signs in last 24 hours: Temp:  [98.1 F (36.7 C)-98.9 F (37.2 C)] 98.9 F (37.2 C) (04/17 1822) Pulse Rate:  [75-90] 82 (04/17 2330) Resp:  [16-18] 18 (04/17 2330) BP: (119-139)/(56-71) 129/64 (04/17 2330) SpO2:  [92 %-100 %] 94 % (04/17 2330)  Intake/Output from previous day: 04/17 0701 - 04/18 0700 In: 240 [I.V.:240] Out: 0  Intake/Output this shift: No intake/output data recorded.  Recent Labs    06/10/17 1449 06/11/17 0326  HGB 13.5 12.6   Recent Labs    06/10/17 1449 06/11/17 0326  WBC 14.5* 10.2  RBC 4.57 4.28  HCT 40.7 38.2  PLT 224 218   Recent Labs    06/10/17 1449 06/11/17 0326  NA  --  134*  K  --  4.1  CL  --  101  CO2  --  27  BUN  --  10  CREATININE 0.55 0.57  GLUCOSE  --  119*  CALCIUM  --  8.6*   No results for input(s): LABPT, INR in the last 72 hours.  EXAM General - Patient is Alert, Appropriate and Oriented Extremity - Neurovascular intact Sensation intact distally Intact pulses distally Dorsiflexion/Plantar flexion intact Incision: dressing C/D/I and wound vac intact with out drainage No cellulitis present Compartment soft Dressing - dressing C/D/I and no drainage, wound VAC intact without drainage Motor Function - intact, moving foot and toes well on exam.   Past Medical History:  Diagnosis Date  . Adiposity   . 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[place])   . Family history of adverse reaction to anesthesia    mother hallucinated  . GERD (gastroesophageal reflux disease)   . History of hiatal hernia   . Hyperlipidemia   . Hypertension   . Osteopenia   . Ulcerative colitis ([place])     Assessment/Plan:   2 Days Post-Op Procedure(s) (LRB): TOTAL  KNEE ARTHROPLASTY (Left) Active Problems:   Status post total left knee replacement  Estimated body mass index is 35.07 kg/m as calculated from the following:   Height as of this encounter: 5' 3"  (1.6 m).   Weight as of this encounter: 89.8 kg (198 lb). Advance diet Up with therapy  Needs bowel movement Pain well controlled  vital signs are stable Care management to assist with discharge to home with home health PT Discharge home today pending bowel movement   DVT Prophylaxis - Lovenox, Foot Pumps and TED hose Weight-Bearing as tolerated to left leg   T. Rachelle Hora, PA-C [place] 06/12/2017, 6:48 AM

## 2017-06-12 NOTE — Progress Notes (Signed)
Physical Therapy Treatment Patient Details Name: Jamie Reid MRN: [id number] DOB: 1951/01/11 Today's Date: 06/12/2017    History of Present Illness 67 y/o female s/p L TKA 06/10/17.    PT Comments    Pt exiting bathroom with nurse upon arrival.  Continued to walk around unit x 1 and complete stair training with ease.  She did have one imbalance where she held onto door frame for support but did not need assistance.  Encouraged her to have her husband with her for mobility upon discharge.  Stated her legs were "tingly" from sitting on commode.  Participated in exercises as described below.  Anticipate discharge to home today after BM.   Follow Up Recommendations  Home health PT     Equipment Recommendations       Recommendations for Other Services       Precautions / Restrictions Precautions Precautions: Fall;Knee Restrictions Weight Bearing Restrictions: Yes LLE Weight Bearing: Weight bearing as tolerated    Mobility  Bed Mobility               General bed mobility comments: walking out of bathroom with nursing upon arrival  Transfers Overall transfer level: Needs assistance Equipment used: Rolling walker (2 wheeled) Transfers: Sit to/from Stand Sit to Stand: Min guard            Ambulation/Gait   Ambulation Distance (Feet): 220 Feet Assistive device: Rolling walker (2 wheeled) Gait Pattern/deviations: Step-through pattern Gait velocity: decreased       Stairs Stairs: Yes Stairs assistance: Min assist;Min guard Stair Management: Two rails Number of Stairs: 4 General stair comments: recalls sequence from yesterday   Wheelchair Mobility    Modified Rankin (Stroke Patients Only)       Balance Overall balance assessment: Needs assistance Sitting-balance support: No upper extremity supported;Feet supported Sitting balance-Leahy Scale: Good     Standing balance support: Bilateral upper extremity supported Standing balance-Leahy Scale:  Fair Standing balance comment: one imbalance coming out of bathroom where she reached for door frame to steady.  encouaged her to have husband with her for safety at home.                            Cognition Arousal/Alertness: Awake/alert Behavior During Therapy: WFL for tasks assessed/performed Overall Cognitive Status: Within Functional Limits for tasks assessed                                        Exercises Total Joint Exercises Ankle Circles/Pumps: AROM;10 reps Quad Sets: Strengthening;10 reps Heel Slides: Strengthening;10 reps;Supine Hip ABduction/ADduction: Strengthening;10 reps;Supine Straight Leg Raises: AROM;10 reps;Supine Long Arc Quad: AAROM;10 reps;Seated Knee Flexion: PROM;5 reps Goniometric ROM: 5-85 - limited by pain/soreness - measured after gait    General Comments        Pertinent Vitals/Pain Pain Assessment: 0-10 Pain Score: 4  Pain Location: L knee after gait/tx Pain Descriptors / Indicators: Aching;Guarding Pain Intervention(s): Limited activity within patient's tolerance;Premedicated before session;Ice applied;Repositioned    Home Living                      Prior Function            PT Goals (current goals can now be found in the care plan section) Progress towards PT goals: Progressing toward goals    Frequency    BID  PT Plan Current plan remains appropriate    Co-evaluation              AM-PAC PT "6 Clicks" Daily Activity  Outcome Measure  Difficulty turning over in bed (including adjusting bedclothes, sheets and blankets)?: None Difficulty moving from lying on back to sitting on the side of the bed? : None Difficulty sitting down on and standing up from a chair with arms (e.g., wheelchair, bedside commode, etc,.)?: None Help needed moving to and from a bed to chair (including a wheelchair)?: A Little Help needed walking in hospital room?: A Little Help needed climbing 3-5 steps  with a railing? : A Little 6 Click Score: 21    End of Session Equipment Utilized During Treatment: Gait belt Activity Tolerance: Patient tolerated treatment well Patient left: in chair;with chair alarm set;with call bell/phone within reach Nurse Communication: Mobility status       Time: 1006-1030 PT Time Calculation (min) (ACUTE ONLY): 24 min  Charges:  $Gait Training: 8-22 mins $Therapeutic Exercise: 8-22 mins                    G Codes:       Chesley Noon, PTA 06/12/17, 10:41 AM

## 2017-06-12 NOTE — Progress Notes (Signed)
Pt had large BM after having one small one. In no acute distress. Rn explained discharge instructions to pt and pt verbalized understanding. IV removed.VSS.  Pt wheeled to visitors entrance and assisted into husbands vehicle.

## 2017-06-12 NOTE — Care Management Note (Signed)
Case Management Note  Patient Details  Name: Jamie Reid MRN: [id number] Date of Birth: [birth year]  Subjective/Objective:  Discharging today                   Action/Plan: Advanced notified of discharge. Cost of Lovenox is $ 82.85. Patient updated and denies issues paying for medication.   Expected Discharge Date:  06/12/17               Expected Discharge Plan:  [place]  In-House Referral:     Discharge planning Services  CM Consult  Post Acute Care Choice:  Home Health Choice offered to:  Patient  DME Arranged:    DME Agency:     HH Arranged:  PT [place]:  [place]  Status of Service:  Completed, signed off  If discussed at [place] of Stay Meetings, dates discussed:    Additional Comments:  Jolly Mango, RN 06/12/2017, 9:34 AM

## 2017-07-30 ENCOUNTER — Ambulatory Visit
Admission: RE | Admit: 2017-07-30 | Discharge: 2017-07-30 | Disposition: A | Discharge status: Home / Self Care | Payer: Medicare HMO | Source: Ambulatory Visit | Attending: Physician Assistant | Admitting: Physician Assistant

## 2017-07-30 DIAGNOSIS — Z1231 Encounter for screening mammogram for malignant neoplasm of breast: Secondary | ICD-10-CM | POA: Insufficient documentation

## 2018-04-24 ENCOUNTER — Other Ambulatory Visit: Payer: Self-pay | Admitting: Nurse Practitioner

## 2018-04-24 DIAGNOSIS — D3500 Benign neoplasm of unspecified adrenal gland: Secondary | ICD-10-CM

## 2018-04-30 ENCOUNTER — Other Ambulatory Visit: Payer: Self-pay | Admitting: Nurse Practitioner

## 2018-04-30 DIAGNOSIS — D3500 Benign neoplasm of unspecified adrenal gland: Secondary | ICD-10-CM

## 2018-06-16 ENCOUNTER — Other Ambulatory Visit: Payer: Self-pay | Admitting: Physician Assistant

## 2018-06-16 DIAGNOSIS — Z1231 Encounter for screening mammogram for malignant neoplasm of breast: Secondary | ICD-10-CM

## 2018-09-10 ENCOUNTER — Other Ambulatory Visit: Payer: Self-pay

## 2018-09-10 ENCOUNTER — Ambulatory Visit
Admission: RE | Admit: 2018-09-10 | Discharge: 2018-09-10 | Disposition: A | Discharge status: Home / Self Care | Payer: Medicare HMO | Source: Ambulatory Visit | Attending: Physician Assistant | Admitting: Physician Assistant

## 2018-09-10 DIAGNOSIS — Z1231 Encounter for screening mammogram for malignant neoplasm of breast: Secondary | ICD-10-CM | POA: Diagnosis not present

## 2018-11-16 DIAGNOSIS — C4491 Basal cell carcinoma of skin, unspecified: Secondary | ICD-10-CM

## 2018-11-16 HISTORY — DX: Basal cell carcinoma of skin, unspecified: C44.91

## 2019-04-07 ENCOUNTER — Ambulatory Visit: Payer: Medicare HMO

## 2019-04-10 ENCOUNTER — Ambulatory Visit: Payer: Medicare HMO | Attending: Internal Medicine

## 2019-04-10 ENCOUNTER — Ambulatory Visit: Payer: Medicare HMO

## 2019-04-10 DIAGNOSIS — Z23 Encounter for immunization: Secondary | ICD-10-CM | POA: Insufficient documentation

## 2019-04-10 NOTE — Progress Notes (Signed)
   Covid-19 Vaccination Clinic  Name:  Mckinzie Saksa    MRN: [id number] DOB: [birth year]  04/10/2019  Ms. Sanson was observed post Covid-19 immunization for 15 minutes without incidence. She was provided with Vaccine Information Sheet and instruction to access the V-Safe system.   Ms. Connaughton was instructed to call 911 with any severe reactions post vaccine: Marland Kitchen Difficulty breathing  . Swelling of your face and throat  . A fast heartbeat  . A bad rash all over your body  . Dizziness and weakness    Immunizations Administered    Name Date Dose VIS Date Route   Pfizer COVID-19 Vaccine 04/10/2019  9:34 AM 0.3 mL 02/05/2019 Intramuscular   Manufacturer: [place]   Lot: [id number]   [place]: [id number]

## 2019-04-29 ENCOUNTER — Other Ambulatory Visit: Payer: Self-pay | Admitting: Physician Assistant

## 2019-04-29 DIAGNOSIS — Z1231 Encounter for screening mammogram for malignant neoplasm of breast: Secondary | ICD-10-CM

## 2019-05-01 ENCOUNTER — Ambulatory Visit: Payer: Medicare HMO | Attending: Internal Medicine

## 2019-05-01 DIAGNOSIS — Z23 Encounter for immunization: Secondary | ICD-10-CM | POA: Insufficient documentation

## 2019-05-01 NOTE — Progress Notes (Signed)
   Covid-19 Vaccination Clinic  Name:  Jamie Reid    MRN: [id number] DOB: 1950-11-22  05/01/2019  Ms. [name] was observed post Covid-19 immunization for 15 minutes without incident. She was provided with Vaccine Information Sheet and instruction to access the V-Safe system.   Ms. [name] was instructed to call 911 with any severe reactions post vaccine: Marland Kitchen Difficulty breathing  . Swelling of face and throat  . A fast heartbeat  . A bad rash all over body  . Dizziness and weakness   Immunizations Administered    Name Date Dose VIS Date Route   Pfizer COVID-19 Vaccine 05/01/2019  8:15 AM 0.3 mL 02/05/2019 Intramuscular   Manufacturer: [place]   Lot: [id number]   [place]: [id number]

## 2019-05-17 ENCOUNTER — Ambulatory Visit: Payer: Medicare HMO | Admitting: Dermatology

## 2019-05-17 ENCOUNTER — Encounter: Payer: Self-pay | Admitting: Dermatology

## 2019-05-17 ENCOUNTER — Other Ambulatory Visit: Payer: Self-pay

## 2019-05-17 DIAGNOSIS — L820 Inflamed seborrheic keratosis: Secondary | ICD-10-CM | POA: Diagnosis not present

## 2019-05-17 DIAGNOSIS — L578 Other skin changes due to chronic exposure to nonionizing radiation: Secondary | ICD-10-CM

## 2019-05-17 DIAGNOSIS — L719 Rosacea, unspecified: Secondary | ICD-10-CM

## 2019-05-17 DIAGNOSIS — L570 Actinic keratosis: Secondary | ICD-10-CM

## 2019-05-17 DIAGNOSIS — Z85828 Personal history of other malignant neoplasm of skin: Secondary | ICD-10-CM | POA: Diagnosis not present

## 2019-05-17 DIAGNOSIS — D1801 Hemangioma of skin and subcutaneous tissue: Secondary | ICD-10-CM | POA: Diagnosis not present

## 2019-05-17 MED ORDER — METRONIDAZOLE 0.75 % EX CREA: TOPICAL_CREAM | Freq: Every day | CUTANEOUS | 6 refills | Status: DC

## 2019-05-17 NOTE — Progress Notes (Signed)
   Follow-Up Visit   Subjective  Jamie Reid is a 69 y.o. female who presents for the following: Follow-up (24mf/u check arms, face and back, recheck [place]akeportR lat mid back- treated last visit) and check spot (L pretibial spot she shaved over last week).   The following portions of the chart were reviewed this encounter and updated as appropriate:     Review of Systems: No other skin or systemic complaints.  Objective  Well appearing patient in no apparent distress; mood and affect are within normal limits.  A focused examination was performed including face, chest, back, arms, legs. Relevant physical exam findings are noted in the Assessment and Plan.  Objective  back x 7, chest x 4, L cheek x 4, L temple x 1, R upper eyebrow x 1, R cheekx  3 (20): Pink scaly macules   Objective  Back, chest:  Actinic changes- Diffuse scaly erythematous macules with underlying dyspigmentation.  Objective  trunk: Red paps  Objective  R lat mid back: Well healed scar with no evidence of recurrence.   Objective  L upper pretibial: Erythematous keratotic papule, gets irritated with shaving  Objective  Face: Erythema  and telangiectasias nose, malar cheeks  Assessment & Plan  AK (actinic keratosis) (20) back x 7, chest x 4, L cheek x 4, L temple x 1, R upper eyebrow x 1, R cheekx  3  Destruction of lesion - back x 7, chest x 4, L cheek x 4, L temple x 1, R upper eyebrow x 1, R cheekx  3 Complexity: extensive   Destruction method: cryotherapy   Informed consent: discussed and consent obtained   Lesion destroyed using liquid nitrogen: Yes   Region frozen until ice ball extended beyond lesion: Yes   Outcome: patient tolerated procedure well with no complications   Post-procedure details: wound care instructions given    Actinic skin damage Back, chest  Recommend broad spectrum SPF and photoprotection   Hemangioma of skin trunk  Benign, observe.    History of basal cell carcinoma  (BCC) R lat mid back  Clear. Observe for recurrence.  Recommend regular skin exams, sunscreen use, and photoprotection.    Inflamed seborrheic keratosis L upper pretibial  Destruction of lesion - L upper pretibial  Destruction method: cryotherapy   Informed consent: discussed and consent obtained   Lesion destroyed using liquid nitrogen: Yes   Region frozen until ice ball extended beyond lesion: Yes   Outcome: patient tolerated procedure well with no complications   Post-procedure details: wound care instructions given   Additional details:  Recheck on f/u  Rosacea Face  Sunscreen daily to face  metroNIDAZOLE (METROCREAM) 0.75 % cream - Face  Return in about 6 months (around 11/17/2019) for TBSE.   I, SOthelia Pulling RMA, am acting as scribe for TBrendolyn Patty MD .

## 2019-09-13 ENCOUNTER — Ambulatory Visit
Admission: RE | Admit: 2019-09-13 | Discharge: 2019-09-13 | Disposition: A | Discharge status: Home / Self Care | Payer: Medicare HMO | Source: Ambulatory Visit | Attending: Physician Assistant | Admitting: Physician Assistant

## 2019-09-13 DIAGNOSIS — Z1231 Encounter for screening mammogram for malignant neoplasm of breast: Secondary | ICD-10-CM | POA: Diagnosis present

## 2019-11-22 ENCOUNTER — Encounter: Payer: Self-pay | Admitting: Dermatology

## 2019-11-22 ENCOUNTER — Ambulatory Visit: Payer: Medicare HMO | Admitting: Dermatology

## 2019-11-22 ENCOUNTER — Other Ambulatory Visit: Payer: Self-pay

## 2019-11-22 DIAGNOSIS — L814 Other melanin hyperpigmentation: Secondary | ICD-10-CM

## 2019-11-22 DIAGNOSIS — Z85828 Personal history of other malignant neoplasm of skin: Secondary | ICD-10-CM

## 2019-11-22 DIAGNOSIS — L820 Inflamed seborrheic keratosis: Secondary | ICD-10-CM

## 2019-11-22 DIAGNOSIS — D2271 Melanocytic nevi of right lower limb, including hip: Secondary | ICD-10-CM

## 2019-11-22 DIAGNOSIS — Z1283 Encounter for screening for malignant neoplasm of skin: Secondary | ICD-10-CM

## 2019-11-22 DIAGNOSIS — L304 Erythema intertrigo: Secondary | ICD-10-CM

## 2019-11-22 DIAGNOSIS — D1801 Hemangioma of skin and subcutaneous tissue: Secondary | ICD-10-CM

## 2019-11-22 DIAGNOSIS — L821 Other seborrheic keratosis: Secondary | ICD-10-CM

## 2019-11-22 DIAGNOSIS — L300 Nummular dermatitis: Secondary | ICD-10-CM

## 2019-11-22 DIAGNOSIS — L578 Other skin changes due to chronic exposure to nonionizing radiation: Secondary | ICD-10-CM

## 2019-11-22 DIAGNOSIS — D229 Melanocytic nevi, unspecified: Secondary | ICD-10-CM

## 2019-11-22 MED ORDER — TRIAMCINOLONE ACETONIDE 0.1 % EX CREA: TOPICAL_CREAM | CUTANEOUS | 1 refills | Status: AC

## 2019-11-22 MED ORDER — KETOCONAZOLE 2 % EX CREA: TOPICAL_CREAM | CUTANEOUS | 3 refills | Status: DC

## 2019-11-22 MED ORDER — HYDROCORTISONE 2.5 % EX CREA: TOPICAL_CREAM | CUTANEOUS | 3 refills | Status: DC

## 2019-11-22 NOTE — Progress Notes (Signed)
Follow-Up Visit   Subjective  Jamie Reid is a 69 y.o. female who presents for the following: TBSE, 6 month follow-up (Hx BCC) and Spots (intermammary, left upper arm, left lower leg, left upper back.  Some are itchy.).   The following portions of the chart were reviewed this encounter and updated as appropriate:      Review of Systems:  No other skin or systemic complaints except as noted in HPI or Assessment and Plan.  Objective  Well appearing patient in no apparent distress; mood and affect are within normal limits.  A full examination was performed including scalp, head, eyes, ears, nose, lips, neck, chest, axillae, abdomen, back, buttocks, bilateral upper extremities, bilateral lower extremities, hands, feet, fingers, toes, fingernails, and toenails. All findings within normal limits unless otherwise noted below.  Objective  Right Lateral Mid Back: Well healed scar with no evidence of recurrence.   Objective  Left Posterior Shoulder: Mild erythema with excoriations.  Objective  Left Supraclavicular Area: L ant shoulder 8.0 mm speckled tan macule  Objective  Left Upper Arm x 1, intermammary chest x 1, L popliteal x 1 (3): Erythematous keratotic or waxy stuck-on papule    Objective  4.0 x 2.0 mm blue macule (blue nevus vrs hemangioma)  Right Calf: 2.5 mm speckled brown macule  Objective  Inframammary: Mild erythema.  Objective  Right Posterior Shoulder: 4.0 x 2.0 mm blue macule   Assessment & Plan   Skin cancer screening performed today.  Actinic Damage - diffuse scaly erythematous macules with underlying dyspigmentation - Recommend daily broad spectrum sunscreen SPF 30+ to sun-exposed areas, reapply every 2 hours as needed.  - Call for new or changing lesions.  Seborrheic Keratoses - Stuck-on, waxy, tan-brown papules and plaques  - Discussed benign etiology and prognosis. - Observe - Call for any changes   Lentigines - Scattered tan  macules - Discussed due to sun exposure - Benign, observe - Call for any changes  Melanocytic Nevi - Tan-brown and/or pink-flesh-colored symmetric macules and papules - Benign appearing on exam today - Observation - Call clinic for new or changing moles - Recommend daily use of broad spectrum spf 30+ sunscreen to sun-exposed areas.   Hemangiomas - Red papules - Discussed benign nature - Observe - Call for any changes    History of basal cell carcinoma (BCC) Right Lateral Mid Back  Clear. Observe for recurrence. Call clinic for new or changing lesions.  Recommend regular skin exams, daily broad-spectrum spf 30+ sunscreen use, and photoprotection.     Nummular dermatitis Left Posterior Shoulder  Start TMC 0.1% Cream Apply to AA qd/bid prn itch.  Recommend mild soap and moisturizing cream 1-2 times daily.   Topical steroids (such as triamcinolone, fluocinolone, fluocinonide, mometasone, clobetasol, halobetasol, betamethasone, hydrocortisone) can cause thinning and lightening of the skin if they are used for too long in the same area. Your physician has selected the right strength medicine for your problem and area affected on the body. Please use your medication only as directed by your physician to prevent side effects.     triamcinolone cream (KENALOG) 0.1 % - Left Posterior Shoulder  Lentigines Left Supraclavicular Area  Benign-appearing.  Observation.  Call clinic for new or changing lesions.  Recommend daily use of broad spectrum spf 30+ sunscreen to sun-exposed areas.      Inflamed seborrheic keratosis (3) Left Upper Arm x 1, intermammary chest x 1, L popliteal x 1  Destruction of lesion - Left Upper Arm x  1, intermammary chest x 1, L popliteal x 1  Destruction method: cryotherapy   Informed consent: discussed and consent obtained   Lesion destroyed using liquid nitrogen: Yes   Region frozen until ice ball extended beyond lesion: Yes   Outcome: patient  tolerated procedure well with no complications   Post-procedure details: wound care instructions given    Nevus Right Calf  Benign-appearing.  Observation.  Call clinic for new or changing moles.  Recommend daily use of broad spectrum spf 30+ sunscreen to sun-exposed areas.    Erythema intertrigo Inframammary  Start 2% Ketoconazole cream Apply under breasts qd/bid prn rash. Start 2.5% Hydrocortisone Cream Apply under breasts qd/bid prn itch. When improved, use only ketoconazole cream qd after shower.  ketoconazole (NIZORAL) 2 % cream - Inframammary  hydrocortisone 2.5 % cream - Inframammary  Hemangioma of skin Right Posterior Shoulder  Vs Blue Nevus  Benign-appearing.  Observation.  Call clinic for new or changing moles.  Recommend daily use of broad spectrum spf 30+ sunscreen to sun-exposed areas.    Return in about 6 months (around 05/21/2020) for AKs, sun exposed areas.   IJamesetta Orleans, CMA, am acting as scribe for Brendolyn Patty, MD .  Documentation: I have reviewed the above documentation for accuracy and completeness, and I agree with the above.  Brendolyn Patty MD

## 2019-11-22 NOTE — Patient Instructions (Addendum)
Cryotherapy Aftercare  . Wash gently with soap and water everyday.   Marland Kitchen Apply Vaseline and Band-Aid daily until healed.    Hydrocortisone Cream and Ketoconazole cream - Apply 1 to 2 times under breasts for rash/itch. Ok to continue ketoconazole cream daily after shower for maintenance.   Topical steroids (such as triamcinolone, fluocinolone, fluocinonide, mometasone, clobetasol, halobetasol, betamethasone, hydrocortisone) can cause thinning and lightening of the skin if they are used for too long in the same area. Your physician has selected the right strength medicine for your problem and area affected on the body. Please use your medication only as directed by your physician to prevent side effects.

## 2020-04-18 ENCOUNTER — Other Ambulatory Visit: Payer: Self-pay | Admitting: Physician Assistant

## 2020-04-18 DIAGNOSIS — Z1231 Encounter for screening mammogram for malignant neoplasm of breast: Secondary | ICD-10-CM

## 2020-05-23 ENCOUNTER — Encounter: Payer: Self-pay | Admitting: Dermatology

## 2020-05-23 ENCOUNTER — Other Ambulatory Visit: Payer: Self-pay

## 2020-05-23 ENCOUNTER — Ambulatory Visit: Payer: Medicare HMO | Admitting: Dermatology

## 2020-05-23 DIAGNOSIS — L570 Actinic keratosis: Secondary | ICD-10-CM

## 2020-05-23 DIAGNOSIS — L578 Other skin changes due to chronic exposure to nonionizing radiation: Secondary | ICD-10-CM

## 2020-05-23 DIAGNOSIS — Z85828 Personal history of other malignant neoplasm of skin: Secondary | ICD-10-CM

## 2020-05-23 DIAGNOSIS — L820 Inflamed seborrheic keratosis: Secondary | ICD-10-CM | POA: Diagnosis not present

## 2020-05-23 NOTE — Progress Notes (Signed)
Follow-Up Visit   Subjective  Jamie Reid is a 70 y.o. female who presents for the following: Follow-up (Patient here for 6 month follow-up Aks. She has a few spots to check on her chest, right ear, and left lower leg. Spots are irritating and she picks at spot on her leg.).  She also has an itchy spot on her R arm that she picks at.   The following portions of the chart were reviewed this encounter and updated as appropriate:       Review of Systems:  No other skin or systemic complaints except as noted in HPI or Assessment and Plan.  Objective  Well appearing patient in no apparent distress; mood and affect are within normal limits.  A focused examination was performed including face, chest, ears, lower legs. Relevant physical exam findings are noted in the Assessment and Plan.  Objective  L medial shoulder x 3, L mid sternum x 1, R ear helix x 1, R pretibia x 6, L lower pretibia x 2, R post shoulder x 1, L thumb x 1 (15): Erythematous thin papules/macules with gritty scale.   Objective  Right Posterior Upper Arm x 1: Erythematous keratotic or waxy stuck-on papule     Assessment & Plan   Actinic Damage - chronic, secondary to cumulative UV radiation exposure/sun exposure over time - diffuse scaly erythematous macules with underlying dyspigmentation - Recommend daily broad spectrum sunscreen SPF 30+ to sun-exposed areas, reapply every 2 hours as needed.  - Recommend staying in the shade or wearing long sleeves, sun glasses (UVA+UVB protection) and wide brim hats (4-inch brim around the entire circumference of the hat). - Call for new or changing lesions.  History of Basal Cell Carcinoma of the Skin - No evidence of recurrence today of the right lateral mid back - Recommend regular full body skin exams - Recommend daily broad spectrum sunscreen SPF 30+ to sun-exposed areas, reapply every 2 hours as needed.  - Call if any new or changing lesions are noted between office  visits  AK (actinic keratosis) (15) L medial shoulder x 3, L mid sternum x 1, R ear helix x 1, R pretibia x 6, L lower pretibia x 2, R post shoulder x 1, L thumb x 1   Prior to procedure, discussed risks of blister formation, small wound, skin dyspigmentation, or rare scar following cryotherapy.    Destruction of lesion - L medial shoulder x 3, L mid sternum x 1, R ear helix x 1, R pretibia x 6, L lower pretibia x 2, R post shoulder x 1, L thumb x 1  Destruction method: cryotherapy   Informed consent: discussed and consent obtained   Lesion destroyed using liquid nitrogen: Yes   Region frozen until ice ball extended beyond lesion: Yes   Outcome: patient tolerated procedure well with no complications   Post-procedure details: wound care instructions given    Inflamed seborrheic keratosis Right Posterior Upper Arm x 1  Prior to procedure, discussed risks of blister formation, small wound, skin dyspigmentation, or rare scar following cryotherapy.    Destruction of lesion - Right Posterior Upper Arm x 1  Destruction method: cryotherapy   Informed consent: discussed and consent obtained   Lesion destroyed using liquid nitrogen: Yes   Region frozen until ice ball extended beyond lesion: Yes   Outcome: patient tolerated procedure well with no complications   Post-procedure details: wound care instructions given    Return in about 6 months (around 11/23/2020) for  TBSE.   I, Jamesetta Orleans, CMA, am acting as scribe for Brendolyn Patty, MD .  Documentation: I have reviewed the above documentation for accuracy and completeness, and I agree with the above.  Brendolyn Patty MD

## 2020-05-23 NOTE — Patient Instructions (Addendum)

## 2020-09-13 ENCOUNTER — Other Ambulatory Visit: Payer: Self-pay

## 2020-09-13 ENCOUNTER — Ambulatory Visit
Admission: RE | Admit: 2020-09-13 | Discharge: 2020-09-13 | Disposition: A | Discharge status: Home / Self Care | Payer: Medicare HMO | Source: Ambulatory Visit | Attending: Physician Assistant | Admitting: Physician Assistant

## 2020-09-13 DIAGNOSIS — Z1231 Encounter for screening mammogram for malignant neoplasm of breast: Secondary | ICD-10-CM | POA: Diagnosis not present

## 2020-12-04 ENCOUNTER — Ambulatory Visit: Payer: Medicare HMO | Admitting: Dermatology

## 2020-12-04 ENCOUNTER — Other Ambulatory Visit: Payer: Self-pay

## 2020-12-04 DIAGNOSIS — Z1283 Encounter for screening for malignant neoplasm of skin: Secondary | ICD-10-CM | POA: Diagnosis not present

## 2020-12-04 DIAGNOSIS — L720 Epidermal cyst: Secondary | ICD-10-CM

## 2020-12-04 DIAGNOSIS — L570 Actinic keratosis: Secondary | ICD-10-CM | POA: Diagnosis not present

## 2020-12-04 DIAGNOSIS — D1801 Hemangioma of skin and subcutaneous tissue: Secondary | ICD-10-CM | POA: Diagnosis not present

## 2020-12-04 DIAGNOSIS — L814 Other melanin hyperpigmentation: Secondary | ICD-10-CM

## 2020-12-04 DIAGNOSIS — L578 Other skin changes due to chronic exposure to nonionizing radiation: Secondary | ICD-10-CM

## 2020-12-04 DIAGNOSIS — L817 Pigmented purpuric dermatosis: Secondary | ICD-10-CM

## 2020-12-04 DIAGNOSIS — D229 Melanocytic nevi, unspecified: Secondary | ICD-10-CM

## 2020-12-04 DIAGNOSIS — D2271 Melanocytic nevi of right lower limb, including hip: Secondary | ICD-10-CM | POA: Diagnosis not present

## 2020-12-04 DIAGNOSIS — D692 Other nonthrombocytopenic purpura: Secondary | ICD-10-CM

## 2020-12-04 DIAGNOSIS — D1800 Hemangioma unspecified site: Secondary | ICD-10-CM

## 2020-12-04 DIAGNOSIS — Z85828 Personal history of other malignant neoplasm of skin: Secondary | ICD-10-CM

## 2020-12-04 NOTE — Progress Notes (Signed)
Follow-Up Visit   Subjective  Jamie Reid is a 70 y.o. female who presents for the following: Total body skin exam (Hx of BCC R lat back). She has a bump on her face which gets irritated as sore.  She has a h/o Aks.   The following portions of the chart were reviewed this encounter and updated as appropriate:       Review of Systems:  No other skin or systemic complaints except as noted in HPI or Assessment and Plan.  Objective  Well appearing patient in no apparent distress; mood and affect are within normal limits.  A full examination was performed including scalp, head, eyes, ears, nose, lips, neck, chest, axillae, abdomen, back, buttocks, bilateral upper extremities, bilateral lower extremities, hands, feet, fingers, toes, fingernails, and toenails. All findings within normal limits unless otherwise noted below.  R post shoulder 4.0 x 2.55m blue macule  R calf  R calf 2.59mbrown macule darker edge  R chest x 1, mid sternum x 1, lower sternum x 1, R wrist x 3, R elbow x 1, R upper knee x 1, R lower ear helix x 1, L cheek x 2, R malar cheek x 1, R upper lip x 1 (13) Pink scaly macules   bil lower legs Stasis changes with cayenne pepper macules  Head - Anterior (Face) Firm white papule   Assessment & Plan   History of Basal Cell Carcinoma of the Skin - No evidence of recurrence today - Recommend regular full body skin exams - Recommend daily broad spectrum sunscreen SPF 30+ to sun-exposed areas, reapply every 2 hours as needed.  - Call if any new or changing lesions are noted between office visits  - R lat mid back  Lentigines - Scattered tan macules - Due to sun exposure - Benign-appearing, observe - Recommend daily broad spectrum sunscreen SPF 30+ to sun-exposed areas, reapply every 2 hours as needed. - Call for any changes  Hemangiomas - Red papules - Discussed benign nature - Observe - Call for any changes  Actinic Damage - Severe, confluent  actinic changes with pre-cancerous actinic keratoses  - Severe, chronic, not at goal, secondary to cumulative UV radiation exposure over time - diffuse scaly erythematous macules and papules with underlying dyspigmentation - Discussed Prescription "Field Treatment" for Severe, Chronic Confluent Actinic Changes with Pre-Cancerous Actinic Keratoses Field treatment involves treatment of an entire area of skin that has confluent Actinic Changes (Sun/ Ultraviolet light damage) and PreCancerous Actinic Keratoses by method of PhotoDynamic Therapy (PDT) and/or prescription Topical Chemotherapy agents such as 5-fluorouracil, 5-fluorouracil/calcipotriene, and/or imiquimod.  The purpose is to decrease the number of clinically evident and subclinical PreCancerous lesions to prevent progression to development of skin cancer by chemically destroying early precancer changes that may or may not be visible.  It has been shown to reduce the risk of developing skin cancer in the treated area. As a result of treatment, redness, scaling, crusting, and open sores may occur during treatment course. One or more than one of these methods may be used and may have to be used several times to control, suppress and eliminate the PreCancerous changes. Discussed treatment course, expected reaction, and possible side effects. - Recommend daily broad spectrum sunscreen SPF 30+ to sun-exposed areas, reapply every 2 hours as needed.  - Staying in the shade or wearing long sleeves, sun glasses (UVA+UVB protection) and wide brim hats (4-inch brim around the entire circumference of the hat) are also recommended. - Call for  new or changing lesions.  - Recommend PDT with ALA to face, pt declines.  Skin cancer screening performed today.  Purpura - Chronic; persistent and recurrent.  Treatable, but not curable. - Violaceous macules and patches - Benign - Related to trauma, age, sun damage and/or use of blood thinners, chronic use of topical  and/or oral steroids - Observe - Can use OTC arnica containing moisturizer such as Dermend Bruise Formula if desired - Call for worsening or other concerns  Hemangioma of skin R post shoulder  Vs Blue Nevus  Stable, benign appearing, observe  Nevus R calf  Benign-appearing.  Stable. Observation.  Call clinic for new or changing moles.  Recommend daily use of broad spectrum spf 30+ sunscreen to sun-exposed areas.    AK (actinic keratosis) (13) R chest x 1, mid sternum x 1, lower sternum x 1, R wrist x 3, R elbow x 1, R upper knee x 1, R lower ear helix x 1, L cheek x 2, R malar cheek x 1, R upper lip x 1  Destruction of lesion - R chest x 1, mid sternum x 1, lower sternum x 1, R wrist x 3, R elbow x 1, R upper knee x 1, R lower ear helix x 1, L cheek x 2, R malar cheek x 1, R upper lip x 1  Destruction method: cryotherapy   Informed consent: discussed and consent obtained   Lesion destroyed using liquid nitrogen: Yes   Region frozen until ice ball extended beyond lesion: Yes   Outcome: patient tolerated procedure well with no complications   Post-procedure details: wound care instructions given   Additional details:  Prior to procedure, discussed risks of blister formation, small wound, skin dyspigmentation, or rare scar following cryotherapy. Recommend Vaseline ointment to treated areas while healing.   Schamberg's purpura bil lower legs  Benign, due to chronic leg swelling  Stasis in the legs causes chronic leg swelling, which may result in itchy or painful rashes, skin discoloration, skin texture changes, and sometimes ulceration.  Recommend daily compression hose/stockings- easiest to put on first thing in morning, remove at bedtime.  Elevate legs as much as possible. Avoid salt/sodium rich foods.   Milia Head - Anterior (Face)  Irritated  Incision and Drainage - Head - Anterior (Face) Procedure risks and benefits were discussed with the patient and verbal consent was  obtained. Following prep of the skin on the R medial cheek with an alcohol swab and local anesthesia injection, extraction of milia was performed with a comedone extractor following superficial incision made over their surfaces with a #11 surgical blade. Capillary hemostasis was achieved with 20% aluminum chloride solution. Vaseline ointment was applied to each site. The patient tolerated the procedure well.  Return in about 6 months (around 06/04/2021) for AK f/u.  I, Othelia Pulling, RMA, am acting as scribe for Brendolyn Patty, MD .  Documentation: I have reviewed the above documentation for accuracy and completeness, and I agree with the above.  Brendolyn Patty MD

## 2020-12-04 NOTE — Patient Instructions (Signed)
If you have any questions or concerns for your doctor, please call our main line at [phone] and press option 4 to reach your doctor's medical assistant. If no one answers, please leave a voicemail as directed and we will return your call as soon as possible. Messages left after 4 pm will be answered the following business day.   You may also send [place] a message via [place]. We typically respond to MyChart messages within 1-2 business days.  For prescription refills, please ask your pharmacy to contact our office. Our fax number is [phone].  If you have an urgent issue when the clinic is closed that cannot wait until the next business day, you can page your doctor at the number below.    Please note that while we do our best to be available for urgent issues outside of office hours, we are not available 24/7.   If you have an urgent issue and are unable to reach [place], you may choose to seek medical care at your doctor's office, retail clinic, urgent care center, or emergency room.  If you have a medical emergency, please immediately call 911 or go to the emergency department.  Pager Numbers  - Dr. Nehemiah Massed: [phone]  - Dr. Laurence Ferrari: [phone]  - Dr. Nicole Kindred: [phone]  In the event of inclement weather, please call our main line at [phone] for an update on the status of any delays or closures.  Dermatology Medication Tips: Please keep the boxes that topical medications come in in order to help keep track of the instructions about where and how to use these. Pharmacies typically print the medication instructions only on the boxes and not directly on the medication tubes.   If your medication is too expensive, please contact our office at [phone] option 4 or send [place] a message through [place].   We are unable to tell what your co-pay for medications will be in advance as this is different depending on your insurance coverage. However, we may be able to find a substitute  medication at lower cost or fill out paperwork to get insurance to cover a needed medication.   If a prior authorization is required to get your medication covered by your insurance company, please allow [place] 1-2 business days to complete this process.  Drug prices often vary depending on where the prescription is filled and some pharmacies may offer cheaper prices.  The website www.goodrx.com contains coupons for medications through different pharmacies. The prices here do not account for what the cost may be with help from insurance (it may be cheaper with your insurance), but the website can give you the price if you did not use any insurance.  - You can print the associated coupon and take it with your prescription to the pharmacy.  - You may also stop by our office during regular business hours and pick up a GoodRx coupon card.  - If you need your prescription sent electronically to a different pharmacy, notify our office through [facility] or by phone at [phone] option 4.   Cryotherapy Aftercare  Wash gently with soap and water everyday.   Apply Vaseline and Band-Aid daily until healed.

## 2020-12-11 ENCOUNTER — Ambulatory Visit: Payer: Medicare HMO | Admitting: Dermatology

## 2021-03-22 ENCOUNTER — Encounter: Payer: Self-pay | Admitting: Ophthalmology

## 2021-03-22 ENCOUNTER — Other Ambulatory Visit: Payer: Self-pay

## 2021-03-22 ENCOUNTER — Ambulatory Visit: Payer: Medicare HMO | Admitting: Certified Registered Nurse Anesthetist

## 2021-03-22 ENCOUNTER — Ambulatory Visit
Admission: RE | Admit: 2021-03-22 | Discharge: 2021-03-22 | Disposition: A | Discharge status: Home / Self Care | Payer: Medicare HMO | Attending: Ophthalmology | Admitting: Ophthalmology

## 2021-03-22 ENCOUNTER — Encounter
Admission: RE | Disposition: A | Discharge status: Home / Self Care | Payer: Self-pay | Source: Home / Self Care | Attending: Ophthalmology

## 2021-03-22 DIAGNOSIS — I10 Essential (primary) hypertension: Secondary | ICD-10-CM | POA: Insufficient documentation

## 2021-03-22 DIAGNOSIS — H2511 Age-related nuclear cataract, right eye: Secondary | ICD-10-CM | POA: Insufficient documentation

## 2021-03-22 HISTORY — PX: CATARACT EXTRACTION W/PHACO: SHX586

## 2021-03-22 SURGERY — PHACOEMULSIFICATION, CATARACT, WITH IOL INSERTION
Anesthesia: Monitor Anesthesia Care | Site: Eye | Laterality: Right

## 2021-03-22 MED ORDER — SIGHTPATH DOSE#1 BSS IO SOLN
INTRAOCULAR | Status: DC | PRN
  Administered 2021-03-22: 15 mL via INTRAOCULAR

## 2021-03-22 MED ORDER — SODIUM CHLORIDE 0.9 % IV SOLN: INTRAVENOUS | Status: DC

## 2021-03-22 MED ORDER — CEFUROXIME OPHTHALMIC INJECTION 1 MG/0.1 ML
INJECTION | OPHTHALMIC | Status: DC | PRN
  Administered 2021-03-22: 0.1 mL via INTRACAMERAL

## 2021-03-22 MED ORDER — BRIMONIDINE TARTRATE-TIMOLOL 0.2-0.5 % OP SOLN
OPHTHALMIC | Status: DC | PRN
  Administered 2021-03-22: 1 [drp] via OPHTHALMIC

## 2021-03-22 MED ORDER — TETRACAINE HCL 0.5 % OP SOLN
1.0000 [drp] | Freq: Once | OPHTHALMIC | Status: AC
  Administered 2021-03-22: 1 [drp] via OPHTHALMIC

## 2021-03-22 MED ORDER — SIGHTPATH DOSE#1 BSS IO SOLN
INTRAOCULAR | Status: DC | PRN
  Administered 2021-03-22: 2 mL

## 2021-03-22 MED ORDER — MIDAZOLAM HCL 2 MG/2ML IJ SOLN
INTRAMUSCULAR | Status: DC | PRN
  Administered 2021-03-22 (×2): 1 mg via INTRAVENOUS

## 2021-03-22 MED ORDER — TETRACAINE HCL 0.5 % OP SOLN
OPHTHALMIC | Status: AC
  Administered 2021-03-22: 1 [drp] via OPHTHALMIC
  Filled 2021-03-22: qty 4

## 2021-03-22 MED ORDER — ARMC OPHTHALMIC DILATING DROPS: 1.0000 "application " | OPHTHALMIC | Status: AC

## 2021-03-22 MED ORDER — MOXIFLOXACIN HCL 0.5 % OP SOLN
OPHTHALMIC | Status: AC
  Administered 2021-03-22: 1 [drp] via OPHTHALMIC
  Filled 2021-03-22: qty 3

## 2021-03-22 MED ORDER — MOXIFLOXACIN HCL 0.5 % OP SOLN
1.0000 [drp] | OPHTHALMIC | Status: AC
  Administered 2021-03-22 (×2): 1 [drp] via OPHTHALMIC

## 2021-03-22 MED ORDER — FENTANYL CITRATE (PF) 100 MCG/2ML IJ SOLN
INTRAMUSCULAR | Status: AC
  Filled 2021-03-22: qty 2

## 2021-03-22 MED ORDER — ARMC OPHTHALMIC DILATING DROPS
OPHTHALMIC | Status: AC
  Filled 2021-03-22: qty 0.5

## 2021-03-22 MED ORDER — MIDAZOLAM HCL 2 MG/2ML IJ SOLN
INTRAMUSCULAR | Status: AC
  Filled 2021-03-22: qty 2

## 2021-03-22 MED ORDER — SIGHTPATH DOSE#1 BSS IO SOLN
INTRAOCULAR | Status: DC | PRN
  Administered 2021-03-22: 70 mL via OPHTHALMIC

## 2021-03-22 MED ORDER — SIGHTPATH DOSE#1 NA HYALUR & NA CHOND-NA HYALUR IO KIT
PACK | INTRAOCULAR | Status: DC | PRN
  Administered 2021-03-22: 1 via OPHTHALMIC

## 2021-03-22 MED ORDER — TETRACAINE HCL 0.5 % OP SOLN
OPHTHALMIC | Status: DC | PRN
  Administered 2021-03-22: 1 [drp] via OPHTHALMIC

## 2021-03-22 SURGICAL SUPPLY — 17 items
CANNULA ANT/CHMB 27G (MISCELLANEOUS) IMPLANT
CANNULA ANT/CHMB 27GA (MISCELLANEOUS) ×2 IMPLANT
CATARACT SUITE SIGHTPATH (MISCELLANEOUS) ×2 IMPLANT
FEE CATARACT SUITE SIGHTPATH (MISCELLANEOUS) ×1 IMPLANT
GLOVE SRG 8 PF TXTR STRL LF DI (GLOVE) ×1 IMPLANT
GLOVE SURG GAMMEX PI TX LF 7.5 (GLOVE) ×1 IMPLANT
GLOVE SURG NONLX 8.0 ULT (GLOVE) ×1 IMPLANT
GLOVE SURG UNDER POLY LF SZ8 (GLOVE) ×2
GOWN STRL REUS W/ TWL LRG LVL3 (GOWN DISPOSABLE) ×2 IMPLANT
GOWN STRL REUS W/TWL LRG LVL3 (GOWN DISPOSABLE) ×4
LENS IOL TECNIS EYHANCE 19.5 (Intraocular Lens) ×1 IMPLANT
NDL FILTER BLUNT 18X1 1/2 (NEEDLE) ×2 IMPLANT
NEEDLE FILTER BLUNT 18X 1/2SAF (NEEDLE) ×2
NEEDLE FILTER BLUNT 18X1 1/2 (NEEDLE) ×2 IMPLANT
SYR 3ML LL SCALE MARK (SYRINGE) ×4 IMPLANT
SYR TB 1ML 27GX1/2 LL (SYRINGE) ×2 IMPLANT
WATER STERILE IRR 500ML POUR (IV SOLUTION) ×1 IMPLANT

## 2021-03-22 NOTE — Transfer of Care (Signed)
Immediate Anesthesia Transfer of Care Note  Patient: Jamie Reid  Procedure(s) Performed: CATARACT EXTRACTION PHACO AND INTRAOCULAR LENS PLACEMENT (IOC) RIGHT 7.94 01:06.4 (Right: Eye)  Patient Location: PACU  Anesthesia Type:MAC  Level of Consciousness: awake, alert  and oriented  Airway & Oxygen Therapy: Patient Spontanous Breathing  Post-op Assessment: Report given to RN and Post -op Vital signs reviewed and stable  Post vital signs: Reviewed and stable  Last Vitals:  Vitals Value Taken Time  BP    Temp    Pulse    Resp    SpO2      Last Pain:  Vitals:   03/22/21 0751  TempSrc: Temporal  PainSc: 0-No pain         Complications: No notable events documented.

## 2021-03-22 NOTE — Op Note (Signed)
LOCATION:  [place]   PREOPERATIVE DIAGNOSIS:    Nuclear sclerotic cataract right eye. H25.11   POSTOPERATIVE DIAGNOSIS:  Nuclear sclerotic cataract right eye.     PROCEDURE:  Phacoemusification with posterior chamber intraocular lens placement of the right eye   ULTRASOUND TIME: Procedure(s): CATARACT EXTRACTION PHACO AND INTRAOCULAR LENS PLACEMENT (IOC) RIGHT 7.94 01:06.4 (Right)  LENS:   Implant Name Type Inv. Item Serial No. Manufacturer Lot No. LRB No. Used Action  LENS IOL TECNIS EYHANCE 19.5 - [id number] Intraocular Lens LENS IOL TECNIS EYHANCE 19.5 [id number] [employer]  Right 1 Implanted         SURGEON:  Wyonia Hough, MD   ANESTHESIA:  Topical with tetracaine drops and 2% Xylocaine jelly, augmented with 1% preservative-free intracameral lidocaine.    COMPLICATIONS:  None.   DESCRIPTION OF PROCEDURE:  The patient was identified in the holding room and transported to the operating room and placed in the supine position under the operating microscope.  The right eye was identified as the operative eye and it was prepped and draped in the usual sterile ophthalmic fashion.   A 1 millimeter clear-corneal paracentesis was made at the 12:00 position.  0.5 ml of preservative-free 1% lidocaine was injected into the anterior chamber. The anterior chamber was filled with Viscoat viscoelastic.  A 2.4 millimeter keratome was used to make a near-clear corneal incision at the 9:00 position.  A curvilinear capsulorrhexis was made with a cystotome and capsulorrhexis forceps.  Balanced salt solution was used to hydrodissect and hydrodelineate the nucleus.   Phacoemulsification was then used in stop and chop fashion to remove the lens nucleus and epinucleus.  The remaining cortex was then removed using the irrigation and aspiration handpiece. Provisc was then placed into the capsular bag to distend it for lens placement.  A lens was then injected into the capsular bag.  The  remaining viscoelastic was aspirated.   Wounds were hydrated with balanced salt solution.  The anterior chamber was inflated to a physiologic pressure with balanced salt solution.  No wound leaks were noted. Cefuroxime 0.1 ml of a 58m/ml solution was injected into the anterior chamber for a dose of 1 mg of intracameral antibiotic at the completion of the case.   Timolol and Brimonidine drops were applied to the eye.  The patient was taken to the recovery room in stable condition without complications of anesthesia or surgery.   Suad Autrey 03/22/2021, 10:06 AM

## 2021-03-22 NOTE — Anesthesia Postprocedure Evaluation (Signed)
Anesthesia Post Note  Patient: Jamie Reid  Procedure(s) Performed: CATARACT EXTRACTION PHACO AND INTRAOCULAR LENS PLACEMENT (IOC) RIGHT 7.94 01:06.4 (Right: Eye)  Patient location during evaluation: Phase II Anesthesia Type: MAC Level of consciousness: awake and alert Pain management: pain level controlled Vital Signs Assessment: post-procedure vital signs reviewed and stable Respiratory status: spontaneous breathing, nonlabored ventilation and respiratory function stable Cardiovascular status: stable and blood pressure returned to baseline Postop Assessment: no apparent nausea or vomiting Anesthetic complications: no   No notable events documented.   Last Vitals:  Vitals:   03/22/21 0759 03/22/21 1010  BP: 132/66 112/87  Pulse:  (!) 57  Resp:  16  Temp:  (!) 36.1 C  SpO2:  100%    Last Pain:  Vitals:   03/22/21 1010  TempSrc: Temporal  PainSc: 0-No pain                 Blima Singer

## 2021-03-22 NOTE — Discharge Instructions (Signed)

## 2021-03-22 NOTE — Anesthesia Preprocedure Evaluation (Signed)
Anesthesia Evaluation  Patient identified by MRN, date of birth, ID band Patient awake    Reviewed: Allergy & Precautions, H&P , NPO status , Patient's Chart, lab work & pertinent test results, reviewed documented beta blocker date and time   History of Anesthesia Complications Negative for: history of anesthetic complications  Airway Mallampati: I  TM Distance: >3 FB Neck ROM: full    Dental  (+) Caps, Dental Advidsory Given, Missing, Teeth Intact   Pulmonary neg pulmonary ROS,           Cardiovascular Exercise Tolerance: Good hypertension, (-) angina(-) CAD, (-) Past MI, (-) Cardiac Stents and (-) CABG (-) dysrhythmias (-) Valvular Problems/Murmurs     Neuro/Psych negative neurological ROS  negative psych ROS   GI/Hepatic Neg liver ROS, hiatal hernia, PUD, GERD  ,  Endo/Other  negative endocrine ROS  Renal/GU negative Renal ROS  negative genitourinary   Musculoskeletal   Abdominal   Peds  Hematology negative hematology ROS (+)   Anesthesia Other Findings Past Medical History: No date: Adiposity No date: 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[place]) No date: Family history of adverse reaction to anesthesia     Comment:  mother hallucinated No date: GERD (gastroesophageal reflux disease) No date: History of hiatal hernia No date: Hyperlipidemia No date: Hypertension No date: Osteopenia No date: Ulcerative colitis ([place])   Reproductive/Obstetrics negative OB ROS                             Anesthesia Physical  Anesthesia Plan  ASA: 2  Anesthesia Plan: MAC   Post-op Pain Management:    Induction: Intravenous  PONV Risk Score and Plan: 2 and Midazolam and Treatment may vary due to age or medical condition  Airway Management Planned: Natural Airway and Nasal Cannula  Additional Equipment:   Intra-op Plan:   Post-operative Plan:   Informed Consent: I have reviewed the patients History and  Physical, chart, labs and discussed the procedure including the risks, benefits and alternatives for the proposed anesthesia with the patient or authorized representative who has indicated his/her understanding and acceptance.     Dental Advisory Given  Plan Discussed with: Anesthesiologist, CRNA and Surgeon  Anesthesia Plan Comments:         Anesthesia Quick Evaluation

## 2021-03-22 NOTE — H&P (Signed)
[facility]   Primary Care Physician:  Marinda Elk, MD Ophthalmologist: Dr. Leandrew Koyanagi  Pre-Procedure History & Physical: HPI:  Jamie Reid is a 71 y.o. female here for ophthalmic surgery.   Past Medical History:  Diagnosis Date   Actinic keratosis    Adiposity    Basal cell carcinoma 11/16/2018   Right lat. mid back. Superficial and nodular patterns. 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[place])    Family history of adverse reaction to anesthesia    mother hallucinated   GERD (gastroesophageal reflux disease)    History of hiatal hernia    Hyperlipidemia    Hypertension    Osteopenia    Ulcerative colitis ([place])     Past Surgical History:  Procedure Laterality Date   ABDOMINAL HYSTERECTOMY     CHOLECYSTECTOMY     COLONOSCOPY     FRACTURE SURGERY Right    lower leg   GALLBLADDER SURGERY     TOTAL KNEE ARTHROPLASTY Left 06/10/2017   Procedure: TOTAL KNEE ARTHROPLASTY;  Surgeon: Hessie Knows, MD;  Location: [facility] ORS;  Service: Orthopedics;  Laterality: Left;    Prior to Admission medications   Medication Sig Start Date End Date Taking? Authorizing Provider  acetaminophen (TYLENOL) 500 MG tablet Take 500 mg by mouth every 4 (four) hours as needed for moderate pain.   Yes [provider]  Alpha-Lipoic Acid 600 MG CAPS Take 600 mg by mouth at bedtime.   Yes [provider]  Cholecalciferol (VITAMIN D) 50 MCG (2000 UT) tablet Take 2,000 Units by mouth daily.   Yes [provider]  cyanocobalamin 1000 MCG tablet Take 1,000 mcg by mouth daily.   Yes [provider]  ketoconazole (NIZORAL) 2 % cream Apply to rash under breasts 1-2 times daily as needed for rash. 11/22/19  Yes Brendolyn Patty, MD  losartan (COZAAR) 25 MG tablet Take 25 mg by mouth daily. 05/06/20  Yes [provider]  Mesalamine 800 MG TBEC Take 800 mg by mouth daily.   Yes [provider]  pantoprazole (PROTONIX) 20 MG tablet Take 20 mg by  mouth daily.   Yes [provider]  rosuvastatin (CRESTOR) 5 MG tablet Take 5 mg by mouth at bedtime. 05/04/19  Yes [provider]  triamcinolone cream (KENALOG) 0.1 % Apply to itchy rash on back 1-2 times daily until improved. Avoid face, groin, underarms. 11/22/19  Yes Brendolyn Patty, MD  hydrocortisone 2.5 % cream Apply to rash under breasts 1-2 times daily as needed for itch. 11/22/19   Brendolyn Patty, MD    Allergies as of 02/22/2021 - Review Complete 12/04/2020  Allergen Reaction Noted   Calcium  10/07/2013   Codeine Nausea Only 10/07/2013   Latex Hives 10/07/2013   Nsaids Other (See Comments) 10/07/2013   Sulfa antibiotics Other (See Comments) 10/07/2013   Tramadol Nausea Only 09/30/2014    Family History  Problem Relation Age of Onset   Heart attack Mother    Heart attack Father    Colon cancer Sister    Breast cancer Neg Hx     Social History   Socioeconomic History   Marital status: Married    Spouse name: Not on file   Number of children: Not on file   Years of education: Not on file   Highest education level: Not on file  Occupational History   Not on file  Tobacco Use   Smoking status: Never   Smokeless tobacco: Never  Vaping Use  Vaping Use: Never used  Substance and Sexual Activity   Alcohol use: No   Drug use: No   Sexual activity: Yes  Other Topics Concern   Not on file  Social History Narrative   Not on file   Social Determinants of Health   Financial Resource Strain: Not on file  Food Insecurity: Not on file  Transportation Needs: Not on file  Physical Activity: Not on file  Stress: Not on file  Social Connections: Not on file  Intimate Partner Violence: Not on file    Review of Systems: See HPI, otherwise negative ROS  Physical Exam: Ht 5' 3"  (1.6 m)    Wt 82.6 kg    BMI 32.24 kg/m  General:   Alert,  pleasant and cooperative in NAD Head:  Normocephalic and atraumatic. Lungs:  Clear to auscultation.    Heart:   Regular rate and rhythm.   Impression/Plan: Jamie Reid is here for ophthalmic surgery.  Risks, benefits, limitations, and alternatives regarding ophthalmic surgery have been reviewed with the patient.  Questions have been answered.  All parties agreeable.   Leandrew Koyanagi, MD  03/22/2021, 7:52 AM

## 2021-03-22 NOTE — Anesthesia Procedure Notes (Signed)
Date/Time: 03/22/2021 9:47 AM Performed by: Demetrius Charity, CRNA Oxygen Delivery Method: Nasal cannula Placement Confirmation: CO2 detector and positive ETCO2

## 2021-06-04 ENCOUNTER — Ambulatory Visit: Payer: Medicare HMO | Admitting: Dermatology

## 2021-06-12 ENCOUNTER — Ambulatory Visit: Payer: Medicare HMO | Admitting: Dermatology

## 2021-06-12 DIAGNOSIS — L578 Other skin changes due to chronic exposure to nonionizing radiation: Secondary | ICD-10-CM | POA: Diagnosis not present

## 2021-06-12 DIAGNOSIS — L720 Epidermal cyst: Secondary | ICD-10-CM

## 2021-06-12 DIAGNOSIS — L570 Actinic keratosis: Secondary | ICD-10-CM

## 2021-06-12 NOTE — Progress Notes (Signed)
? ?Follow-Up Visit ?  ?Subjective  ?Jamie Reid is a 71 y.o. female who presents for the following: Follow-up. ? ?Patient here for 6 month follow-up Aks. She has a couple of dry spots to check on the left nose, right nose, and left temple. No history of bleeding. She also has a white spot on her right upper eyelid that came up 3 weeks ago.  ? ?The following portions of the chart were reviewed this encounter and updated as appropriate:  ?  ?  ? ?Review of Systems:  No other skin or systemic complaints except as noted in HPI or Assessment and Plan. ? ?Objective  ?Well appearing patient in no apparent distress; mood and affect are within normal limits. ? ?A focused examination was performed including face, arms, legs. Relevant physical exam findings are noted in the Assessment and Plan. ? ?R nasal ala x 1, L nose x 1, L lateral eye x 1, R mid ear helix x 1, L sternum x 1, mid sternum x 1, L hand dorsum x 1 (7) ?Blanching pink macule with mild scale of the right nasal ala; pink scaly macules ? ? ? ? ? ? ?Assessment & Plan  ?Actinic Damage ?- chronic, secondary to cumulative UV radiation exposure/sun exposure over time ?- diffuse scaly erythematous macules with underlying dyspigmentation ?- Recommend daily broad spectrum sunscreen SPF 30+ to sun-exposed areas, reapply every 2 hours as needed.  ?- Recommend staying in the shade or wearing long sleeves, sun glasses (UVA+UVB protection) and wide brim hats (4-inch brim around the entire circumference of the hat). ?- Call for new or changing lesions. ? ?Milia ?- tiny firm white papule of the right upper eyelid ?- type of cyst ?- benign ?- may be extracted if symptomatic ?- observe ? ?AK (actinic keratosis) (7) ?R nasal ala x 1, L nose x 1, L lateral eye x 1, R mid ear helix x 1, L sternum x 1, mid sternum x 1, L hand dorsum x 1 ? ?vs telangiectasia of the R nasal ala. Photo today and cryotherapy today. Recheck on f/u.  ? ?Actinic keratoses are precancerous spots that  appear secondary to cumulative UV radiation exposure/sun exposure over time. They are chronic with expected duration over 1 year. A portion of actinic keratoses will progress to squamous cell carcinoma of the skin. It is not possible to reliably predict which spots will progress to skin cancer and so treatment is recommended to prevent development of skin cancer. ? ?Recommend daily broad spectrum sunscreen SPF 30+ to sun-exposed areas, reapply every 2 hours as needed.  ?Recommend staying in the shade or wearing long sleeves, sun glasses (UVA+UVB protection) and wide brim hats (4-inch brim around the entire circumference of the hat). ?Call for new or changing lesions. ? ?Destruction of lesion - R nasal ala x 1, L nose x 1, L lateral eye x 1, R mid ear helix x 1, L sternum x 1, mid sternum x 1, L hand dorsum x 1 ? ?Destruction method: cryotherapy   ?Informed consent: discussed and consent obtained   ?Lesion destroyed using liquid nitrogen: Yes   ?Region frozen until ice ball extended beyond lesion: Yes   ?Outcome: patient tolerated procedure well with no complications   ?Post-procedure details: wound care instructions given   ?Additional details:  Prior to procedure, discussed risks of blister formation, small wound, skin dyspigmentation, or rare scar following cryotherapy. Recommend Vaseline ointment to treated areas while healing.  ? ? ?Return in about 6 months (  around 12/12/2021) for TBSE, Hx BCC, AKs, Recheck right nose. ? ?I, Jamesetta Orleans, CMA, am acting as scribe for Brendolyn Patty, MD . ? ?Documentation: I have reviewed the above documentation for accuracy and completeness, and I agree with the above. ? ?Brendolyn Patty MD  ? ?

## 2021-06-12 NOTE — Patient Instructions (Addendum)

## 2021-06-27 ENCOUNTER — Other Ambulatory Visit: Payer: Self-pay | Admitting: Physician Assistant

## 2021-06-27 DIAGNOSIS — Z1231 Encounter for screening mammogram for malignant neoplasm of breast: Secondary | ICD-10-CM

## 2021-08-27 ENCOUNTER — Ambulatory Visit
Admission: RE | Admit: 2021-08-27 | Discharge: 2021-08-27 | Disposition: A | Discharge status: Home / Self Care | Payer: Medicare HMO | Source: Ambulatory Visit | Attending: Physician Assistant | Admitting: Physician Assistant

## 2021-08-27 DIAGNOSIS — Z1231 Encounter for screening mammogram for malignant neoplasm of breast: Secondary | ICD-10-CM | POA: Diagnosis present

## 2021-12-04 ENCOUNTER — Encounter: Payer: Self-pay | Admitting: Orthopedic Surgery

## 2021-12-04 ENCOUNTER — Other Ambulatory Visit: Payer: Self-pay | Admitting: Orthopedic Surgery

## 2021-12-04 DIAGNOSIS — Z01818 Encounter for other preprocedural examination: Secondary | ICD-10-CM

## 2021-12-04 NOTE — H&P (Signed)
Jamie Reid MRN:  [id number] DOB/SEX:  04-13-1950/female  CHIEF COMPLAINT:  Painful right Knee  HISTORY: Patient is a 71 y.o. female presented with a history of pain in the right knee. Onset of symptoms was gradual starting several years ago with gradually worsening course since that time. Prior procedures on the knee include none. Patient has been treated conservatively with over-the-counter NSAIDs and activity modification. Patient currently rates pain in the knee at 10 out of 10 with activity. There is pain at night.  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 mid back. Superficial and nodular patterns. 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[place])    Family history of adverse reaction to anesthesia    mother hallucinated   GERD (gastroesophageal reflux disease)    History of hiatal hernia    Hyperlipidemia    Hypertension    Osteopenia    Ulcerative colitis ([place])    Past Surgical History:  Procedure Laterality Date   ABDOMINAL HYSTERECTOMY     CATARACT EXTRACTION W/PHACO Right 03/22/2021   Procedure: CATARACT EXTRACTION PHACO AND INTRAOCULAR LENS PLACEMENT (IOC) RIGHT 7.94 01:06.4;  Surgeon: Leandrew Koyanagi, MD;  Location: [facility] ORS;  Service: Ophthalmology;  Laterality: Right;   CHOLECYSTECTOMY     COLONOSCOPY     FRACTURE SURGERY Right    lower leg   GALLBLADDER SURGERY     TOTAL KNEE ARTHROPLASTY Left 06/10/2017   Procedure: TOTAL KNEE ARTHROPLASTY;  Surgeon: Hessie Knows, MD;  Location: [facility] ORS;  Service: Orthopedics;  Laterality: Left;     MEDICATIONS:  (Not  in a hospital admission)   ALLERGIES:   Allergies  Allergen Reactions   Calcium     Due to UC   Codeine Nausea Only    dizzy   Latex Hives   Nsaids Other (See Comments)    Aggravate ulcerative colitis   Sulfa Antibiotics Other (See Comments)    Unknown   Tramadol Nausea Only    REVIEW OF SYSTEMS:  Pertinent items are noted in HPI.   FAMILY HISTORY:   Family History  Problem Relation Age of Onset   Heart attack Mother    Heart attack Father    Colon cancer Sister    Breast cancer Neg Hx     SOCIAL HISTORY:   Social History   Tobacco Use   Smoking status: Never   Smokeless tobacco: Never  Substance Use Topics   Alcohol use: No     EXAMINATION:  Vital signs in last 24 hours: @VSRANGES @  General appearance: alert, cooperative, and no distress Neck: no JVD and supple, symmetrical, trachea midline Lungs: clear to auscultation bilaterally Heart: regular rate and rhythm, S1, S2 normal, no murmur, click, rub or gallop Abdomen: soft, non-tender; bowel sounds normal; no masses,  no organomegaly Extremities: extremities normal, atraumatic, no cyanosis or edema and Homans sign is negative, no sign of DVT Pulses: 2+ and symmetric Skin: Skin color, texture, turgor normal. No rashes or lesions Neurologic: Alert and oriented X 3, normal strength and tone. Normal symmetric reflexes. Normal coordination and gait  Musculoskeletal:  ROM  0-110, Ligaments intact,  Imaging Review Plain radiographs demonstrate severe degenerative joint disease of the right knee. The overall alignment is significant varus. The bone quality appears to be good for age and reported activity level.  Assessment/Plan: Primary osteoarthritis, right knee   The patient history, physical examination and imaging studies are consistent with advanced degenerative joint disease of the right knee. The patient has failed conservative treatment.  The clearance notes were reviewed.  After discussion with the  patient it was felt that Total Knee Replacement was indicated. The procedure,  risks, and benefits of total knee arthroplasty were presented and reviewed. The risks including but not limited to aseptic loosening, infection, blood clots, vascular injury, stiffness, patella tracking problems complications among others were discussed. The patient acknowledged the explanation, agreed to proceed with the plan.  Jamie Reid 12/04/2021, 3:18 PM

## 2021-12-04 NOTE — H&P (Deleted)
  The note originally documented on this encounter has been moved the the encounter in which it belongs.  

## 2021-12-13 ENCOUNTER — Encounter
Admission: RE | Admit: 2021-12-13 | Discharge: 2021-12-13 | Disposition: A | Discharge status: Home / Self Care | Payer: Medicare HMO | Source: Ambulatory Visit | Attending: Orthopedic Surgery | Admitting: Orthopedic Surgery

## 2021-12-13 VITALS — BP 158/76 | HR 67 | Temp 97.6°F | Resp 18 | Ht 65.5 in | Wt 183.0 lb

## 2021-12-13 DIAGNOSIS — Z01812 Encounter for preprocedural laboratory examination: Secondary | ICD-10-CM

## 2021-12-13 DIAGNOSIS — E785 Hyperlipidemia, unspecified: Secondary | ICD-10-CM

## 2021-12-13 DIAGNOSIS — Z01818 Encounter for other preprocedural examination: Secondary | ICD-10-CM | POA: Diagnosis present

## 2021-12-13 DIAGNOSIS — I10 Essential (primary) hypertension: Secondary | ICD-10-CM

## 2021-12-13 HISTORY — DX: Unspecified osteoarthritis, unspecified site: M19.90

## 2021-12-13 LAB — BASIC METABOLIC PANEL
Anion gap: 8 (ref 5–15)
BUN: 19 mg/dL (ref 8–23)
CO2: 27 mmol/L (ref 22–32)
Calcium: 9.3 mg/dL (ref 8.9–10.3)
Chloride: 102 mmol/L (ref 98–111)
Creatinine, Ser: 0.71 mg/dL (ref 0.44–1.00)
GFR, Estimated: >60 mL/min (ref >60)
Glucose, Bld: 93 mg/dL (ref 70–99)
Potassium: 4.4 mmol/L (ref 3.5–5.1)
Sodium: 137 mmol/L (ref 135–145)

## 2021-12-13 LAB — SURGICAL PCR SCREEN
MRSA, PCR: NEGATIVE
Staphylococcus aureus: POSITIVE — AB

## 2021-12-13 LAB — URINALYSIS, ROUTINE W REFLEX MICROSCOPIC
Bacteria, UA: NONE SEEN
Bilirubin Urine: NEGATIVE
Glucose, UA: NEGATIVE mg/dL
Hgb urine dipstick: NEGATIVE
Ketones, ur: NEGATIVE mg/dL
Nitrite: NEGATIVE
Protein, ur: NEGATIVE mg/dL
Specific Gravity, Urine: 1.003 — ABNORMAL LOW (ref 1.005–1.030)
pH: 7 (ref 5.0–8.0)

## 2021-12-13 LAB — TYPE AND SCREEN
ABO/RH(D): O POS
Antibody Screen: NEGATIVE

## 2021-12-13 LAB — CBC
HCT: 43.6 % (ref 36.0–46.0)
Hemoglobin: 14.1 g/dL (ref 12.0–15.0)
MCH: 29.4 pg (ref 26.0–34.0)
MCHC: 32.3 g/dL (ref 30.0–36.0)
MCV: 90.8 fL (ref 80.0–100.0)
Platelets: 218 10*3/uL (ref 150–400)
RBC: 4.8 MIL/uL (ref 3.87–5.11)
RDW: 13 % (ref 11.5–15.5)
WBC: 7.1 10*3/uL (ref 4.0–10.5)
nRBC: 0 % (ref 0.0–0.2)

## 2021-12-13 NOTE — Patient Instructions (Signed)
Your procedure is scheduled on: Tuesday December 25, 2021. Report to Day Surgery inside [facility] 2nd floor, stop by registration desk before getting on elevator.  To find out your arrival time please call [phone] between 1PM - 3PM on Monday December 24, 2021.  Remember: Instructions that are not followed completely may result in serious medical risk,  up to and including death, or upon the discretion of your surgeon and anesthesiologist your  surgery may need to be rescheduled.     _X__ 1. Do not eat food after midnight the night before your procedure.                 No chewing gum or hard candies.   __X__2.  On the morning of surgery brush your teeth with toothpaste and water, you                may rinse your mouth with mouthwash if you wish.  Do not swallow any toothpaste or mouthwash.     _X__ 3.  No Alcohol for 24 hours before or after surgery.   _X__ 4.  Do Not Smoke or use e-cigarettes For 24 Hours Prior to Your Surgery.                 Do not use any chewable tobacco products for at least 6 hours prior to                 Surgery.  _X__  5.  Do not use any recreational drugs (marijuana, cocaine, heroin, ecstasy, MDMA or other)                For at least one week prior to your surgery.  Combination of these drugs with anesthesia                May have life threatening results.  ____  6.  Bring all medications with you on the day of surgery if instructed.   __X__  7.  Notify your doctor if there is any change in your medical condition      (cold, fever, infections).     Do not wear jewelry, make-up, hairpins, clips or nail polish. Do not wear lotions, powders, or perfumes. You may wear deodorant. Do not shave 48 hours prior to surgery. Men may shave face and neck. Do not bring valuables to the hospital.    [facility] is not responsible for any belongings or valuables.  Contacts, dentures or bridgework may not be worn into surgery. Leave  your suitcase in the car. After surgery it may be brought to your room. For patients admitted to the hospital, discharge time is determined by your treatment team.   Patients discharged the day of surgery will not be allowed to drive home.   Make arrangements for someone to be with you for the first 24 hours of your Same Day Discharge.   __X__ Take these medicines the morning of surgery with A SIP OF WATER:    1. pantoprazole (PROTONIX) 20 MG  2.   3.   4.  5.  6.  ____ Fleet Enema (as directed)   __X__ Use CHG Soap (or wipes) as directed  ____ Use Benzoyl Peroxide Gel as instructed  ____ Use inhalers on the day of surgery  ____ Stop metformin 2 days prior to surgery    ____ Take 1/2 of usual insulin dose the night before surgery. No insulin the morning  of surgery.   ____ Call your PCP, cardiologist, or Pulmonologist if taking Coumadin/Plavix/aspirin and ask when to stop before your surgery.   __X__ One Week prior to surgery- Stop Anti-inflammatories such as Ibuprofen, Aleve, Advil, Motrin, meloxicam (MOBIC), diclofenac, etodolac, ketorolac, Toradol, Daypro, piroxicam, Goody's or BC powders. OK TO USE TYLENOL IF NEEDED   __X__ Stop supplements until after surgery.    ____ Bring C-Pap to the hospital.    If you have any questions regarding your pre-procedure instructions,  Please call Pre-admit Testing at [phone]

## 2021-12-18 ENCOUNTER — Ambulatory Visit: Payer: Medicare HMO | Admitting: Dermatology

## 2021-12-19 ENCOUNTER — Ambulatory Visit: Payer: Medicare HMO | Admitting: Dermatology

## 2021-12-25 ENCOUNTER — Other Ambulatory Visit: Payer: Self-pay | Admitting: Orthopedic Surgery

## 2022-01-03 ENCOUNTER — Inpatient Hospital Stay: Admission: RE | Admit: 2022-01-03 | Payer: Medicare HMO | Source: Ambulatory Visit

## 2022-01-07 ENCOUNTER — Encounter
Admission: RE | Admit: 2022-01-07 | Discharge: 2022-01-07 | Disposition: A | Discharge status: Home / Self Care | Payer: Medicare HMO | Source: Ambulatory Visit | Attending: Orthopedic Surgery | Admitting: Orthopedic Surgery

## 2022-01-07 DIAGNOSIS — Z01812 Encounter for preprocedural laboratory examination: Secondary | ICD-10-CM

## 2022-01-14 MED ORDER — CEFAZOLIN SODIUM-DEXTROSE 2-4 GM/100ML-% IV SOLN
2.0000 g | INTRAVENOUS | Status: AC
  Administered 2022-01-15: 2 g via INTRAVENOUS

## 2022-01-14 MED ORDER — SODIUM CHLORIDE 0.9 % IV SOLN: INTRAVENOUS | Status: DC

## 2022-01-14 MED ORDER — ORAL CARE MOUTH RINSE: 15.0000 mL | Freq: Once | OROMUCOSAL | Status: AC

## 2022-01-14 MED ORDER — POVIDONE-IODINE 10 % EX SWAB
2.0000 | Freq: Once | CUTANEOUS | Status: AC
  Administered 2022-01-15: 2 via TOPICAL

## 2022-01-14 MED ORDER — CHLORHEXIDINE GLUCONATE 0.12 % MT SOLN: 15.0000 mL | Freq: Once | OROMUCOSAL | Status: AC

## 2022-01-14 MED ORDER — TRANEXAMIC ACID-NACL 1000-0.7 MG/100ML-% IV SOLN
1000.0000 mg | INTRAVENOUS | Status: AC
  Administered 2022-01-15: 1000 mg via INTRAVENOUS

## 2022-01-15 ENCOUNTER — Encounter
Admission: RE | Disposition: A | Discharge status: Home / Self Care | Payer: Self-pay | Source: Ambulatory Visit | Attending: Orthopedic Surgery

## 2022-01-15 ENCOUNTER — Other Ambulatory Visit: Payer: Self-pay

## 2022-01-15 ENCOUNTER — Observation Stay
Admission: RE | Admit: 2022-01-15 | Discharge: 2022-01-17 | Disposition: A | Discharge status: Home / Self Care | Payer: Medicare HMO | Source: Ambulatory Visit | Attending: Orthopedic Surgery | Admitting: Orthopedic Surgery

## 2022-01-15 ENCOUNTER — Ambulatory Visit: Payer: Medicare HMO

## 2022-01-15 ENCOUNTER — Ambulatory Visit: Payer: Medicare HMO | Admitting: Urgent Care

## 2022-01-15 ENCOUNTER — Encounter: Payer: Self-pay | Admitting: Orthopedic Surgery

## 2022-01-15 DIAGNOSIS — Z85828 Personal history of other malignant neoplasm of skin: Secondary | ICD-10-CM | POA: Diagnosis not present

## 2022-01-15 DIAGNOSIS — Z96651 Presence of right artificial knee joint: Secondary | ICD-10-CM

## 2022-01-15 DIAGNOSIS — I10 Essential (primary) hypertension: Secondary | ICD-10-CM | POA: Insufficient documentation

## 2022-01-15 DIAGNOSIS — M1711 Unilateral primary osteoarthritis, right knee: Principal | ICD-10-CM | POA: Insufficient documentation

## 2022-01-15 DIAGNOSIS — Z01812 Encounter for preprocedural laboratory examination: Secondary | ICD-10-CM

## 2022-01-15 HISTORY — PX: TOTAL KNEE ARTHROPLASTY: SHX125

## 2022-01-15 LAB — CBC
HCT: 40.9 % (ref 36.0–46.0)
Hemoglobin: 13.4 g/dL (ref 12.0–15.0)
MCH: 29.6 pg (ref 26.0–34.0)
MCHC: 32.8 g/dL (ref 30.0–36.0)
MCV: 90.5 fL (ref 80.0–100.0)
Platelets: 202 10*3/uL (ref 150–400)
RBC: 4.52 MIL/uL (ref 3.87–5.11)
RDW: 12.8 % (ref 11.5–15.5)
WBC: 16.6 10*3/uL — ABNORMAL HIGH (ref 4.0–10.5)
nRBC: 0 % (ref 0.0–0.2)

## 2022-01-15 LAB — GLUCOSE, CAPILLARY: Glucose-Capillary: 85 mg/dL (ref 70–99)

## 2022-01-15 LAB — TYPE AND SCREEN
ABO/RH(D): O POS
Antibody Screen: NEGATIVE

## 2022-01-15 LAB — CREATININE, SERUM
Creatinine, Ser: 0.61 mg/dL (ref 0.44–1.00)
GFR, Estimated: >60 mL/min (ref >60)

## 2022-01-15 SURGERY — ARTHROPLASTY, KNEE, TOTAL
Anesthesia: Spinal | Site: Knee | Laterality: Right

## 2022-01-15 MED ORDER — DOCUSATE SODIUM 100 MG PO CAPS
100.0000 mg | ORAL_CAPSULE | Freq: Two times a day (BID) | ORAL | Status: DC
  Administered 2022-01-15 – 2022-01-17 (×3): 100 mg via ORAL
  Filled 2022-01-15 (×2): qty 1

## 2022-01-15 MED ORDER — ACETAMINOPHEN 325 MG PO TABS: 325.0000 mg | ORAL_TABLET | Freq: Four times a day (QID) | ORAL | Status: DC | PRN

## 2022-01-15 MED ORDER — METOCLOPRAMIDE HCL 5 MG/ML IJ SOLN: 5.0000 mg | Freq: Three times a day (TID) | INTRAMUSCULAR | Status: DC | PRN

## 2022-01-15 MED ORDER — HYDROCODONE-ACETAMINOPHEN 7.5-325 MG PO TABS
1.0000 | ORAL_TABLET | ORAL | Status: DC | PRN
  Administered 2022-01-15 (×2): 2 via ORAL
  Filled 2022-01-15 (×2): qty 2

## 2022-01-15 MED ORDER — LIDOCAINE HCL (PF) 1 % IJ SOLN
INTRAMUSCULAR | Status: AC
  Filled 2022-01-15: qty 5

## 2022-01-15 MED ORDER — KETAMINE HCL 10 MG/ML IJ SOLN
INTRAMUSCULAR | Status: DC | PRN
  Administered 2022-01-15: 30 mg via INTRAVENOUS

## 2022-01-15 MED ORDER — ACETAMINOPHEN 500 MG PO TABS
500.0000 mg | ORAL_TABLET | Freq: Four times a day (QID) | ORAL | Status: AC
  Administered 2022-01-15 – 2022-01-16 (×3): 500 mg via ORAL
  Filled 2022-01-15 (×2): qty 1

## 2022-01-15 MED ORDER — TRANEXAMIC ACID-NACL 1000-0.7 MG/100ML-% IV SOLN
INTRAVENOUS | Status: AC
  Filled 2022-01-15: qty 100

## 2022-01-15 MED ORDER — HYDROMORPHONE HCL 1 MG/ML IJ SOLN: 0.2500 mg | INTRAMUSCULAR | Status: DC | PRN

## 2022-01-15 MED ORDER — PROPOFOL 500 MG/50ML IV EMUL
INTRAVENOUS | Status: DC | PRN
  Administered 2022-01-15: 200 ug/kg/min via INTRAVENOUS

## 2022-01-15 MED ORDER — ONDANSETRON HCL 4 MG/2ML IJ SOLN
INTRAMUSCULAR | Status: DC | PRN
  Administered 2022-01-15: 4 mg via INTRAVENOUS

## 2022-01-15 MED ORDER — BUPIVACAINE-EPINEPHRINE (PF) 0.5% -1:200000 IJ SOLN
INTRAMUSCULAR | Status: AC
  Filled 2022-01-15: qty 30

## 2022-01-15 MED ORDER — ONDANSETRON HCL 4 MG/2ML IJ SOLN
4.0000 mg | Freq: Four times a day (QID) | INTRAMUSCULAR | Status: DC | PRN
  Administered 2022-01-16 (×2): 4 mg via INTRAVENOUS
  Filled 2022-01-15 (×2): qty 2

## 2022-01-15 MED ORDER — PHENOL 1.4 % MT LIQD: 1.0000 | OROMUCOSAL | Status: DC | PRN

## 2022-01-15 MED ORDER — OXYCODONE HCL 5 MG/5ML PO SOLN: 5.0000 mg | Freq: Once | ORAL | Status: DC | PRN

## 2022-01-15 MED ORDER — BISACODYL 10 MG RE SUPP: 10.0000 mg | Freq: Every day | RECTAL | Status: DC | PRN

## 2022-01-15 MED ORDER — FENTANYL CITRATE (PF) 100 MCG/2ML IJ SOLN
INTRAMUSCULAR | Status: AC
  Filled 2022-01-15: qty 2

## 2022-01-15 MED ORDER — ENOXAPARIN SODIUM 30 MG/0.3ML IJ SOSY
30.0000 mg | PREFILLED_SYRINGE | Freq: Two times a day (BID) | INTRAMUSCULAR | Status: DC
  Administered 2022-01-16 – 2022-01-17 (×3): 30 mg via SUBCUTANEOUS
  Filled 2022-01-15 (×3): qty 0.3

## 2022-01-15 MED ORDER — LACTATED RINGERS IV SOLN: INTRAVENOUS | Status: DC

## 2022-01-15 MED ORDER — PHENYLEPHRINE 80 MCG/ML (10ML) SYRINGE FOR IV PUSH (FOR BLOOD PRESSURE SUPPORT)
PREFILLED_SYRINGE | INTRAVENOUS | Status: DC | PRN
  Administered 2022-01-15: 80 ug via INTRAVENOUS

## 2022-01-15 MED ORDER — FENTANYL CITRATE PF 50 MCG/ML IJ SOSY
PREFILLED_SYRINGE | INTRAMUSCULAR | Status: AC
  Administered 2022-01-15: 50 ug via INTRAVENOUS
  Filled 2022-01-15: qty 1

## 2022-01-15 MED ORDER — LOSARTAN POTASSIUM 25 MG PO TABS
25.0000 mg | ORAL_TABLET | Freq: Every day | ORAL | Status: DC
  Administered 2022-01-15 – 2022-01-17 (×2): 25 mg via ORAL
  Filled 2022-01-15 (×2): qty 1

## 2022-01-15 MED ORDER — CEFAZOLIN SODIUM-DEXTROSE 2-4 GM/100ML-% IV SOLN
2.0000 g | Freq: Four times a day (QID) | INTRAVENOUS | Status: AC
  Administered 2022-01-15 – 2022-01-16 (×2): 2 g via INTRAVENOUS
  Filled 2022-01-15 (×2): qty 100

## 2022-01-15 MED ORDER — MIDAZOLAM HCL 2 MG/2ML IJ SOLN: 1.0000 mg | INTRAMUSCULAR | Status: DC | PRN

## 2022-01-15 MED ORDER — PANTOPRAZOLE SODIUM 20 MG PO TBEC
20.0000 mg | DELAYED_RELEASE_TABLET | Freq: Every day | ORAL | Status: DC
  Administered 2022-01-15 – 2022-01-17 (×2): 20 mg via ORAL
  Filled 2022-01-15 (×3): qty 1

## 2022-01-15 MED ORDER — PHENYLEPHRINE HCL-NACL 20-0.9 MG/250ML-% IV SOLN
INTRAVENOUS | Status: AC
  Filled 2022-01-15: qty 250

## 2022-01-15 MED ORDER — PROPOFOL 10 MG/ML IV BOLUS
INTRAVENOUS | Status: DC | PRN
  Administered 2022-01-15 (×2): 50 mg via INTRAVENOUS

## 2022-01-15 MED ORDER — BUPIVACAINE HCL (PF) 0.5 % IJ SOLN
INTRAMUSCULAR | Status: DC | PRN
  Administered 2022-01-15: 20 mL

## 2022-01-15 MED ORDER — CHLORHEXIDINE GLUCONATE 0.12 % MT SOLN
OROMUCOSAL | Status: AC
  Administered 2022-01-15: 15 mL via OROMUCOSAL
  Filled 2022-01-15: qty 15

## 2022-01-15 MED ORDER — MESALAMINE 400 MG PO CPDR
800.0000 mg | DELAYED_RELEASE_CAPSULE | Freq: Every day | ORAL | Status: DC
  Administered 2022-01-15 – 2022-01-17 (×2): 800 mg via ORAL
  Filled 2022-01-15 (×3): qty 2

## 2022-01-15 MED ORDER — DIPHENHYDRAMINE HCL 12.5 MG/5ML PO ELIX: 12.5000 mg | ORAL_SOLUTION | ORAL | Status: DC | PRN

## 2022-01-15 MED ORDER — EPHEDRINE SULFATE (PRESSORS) 50 MG/ML IJ SOLN
INTRAMUSCULAR | Status: DC | PRN
  Administered 2022-01-15: 10 mg via INTRAVENOUS
  Administered 2022-01-15: 5 mg via INTRAVENOUS

## 2022-01-15 MED ORDER — SODIUM CHLORIDE FLUSH 0.9 % IV SOLN
INTRAVENOUS | Status: AC
  Filled 2022-01-15: qty 40

## 2022-01-15 MED ORDER — PHENYLEPHRINE HCL-NACL 20-0.9 MG/250ML-% IV SOLN
INTRAVENOUS | Status: DC | PRN
  Administered 2022-01-15: 20 ug/min via INTRAVENOUS

## 2022-01-15 MED ORDER — VITAMIN B-12 1000 MCG PO TABS
1000.0000 ug | ORAL_TABLET | Freq: Every day | ORAL | Status: DC
  Administered 2022-01-15 – 2022-01-17 (×2): 1000 ug via ORAL
  Filled 2022-01-15 (×2): qty 1

## 2022-01-15 MED ORDER — CEFAZOLIN SODIUM-DEXTROSE 2-4 GM/100ML-% IV SOLN
INTRAVENOUS | Status: AC
  Filled 2022-01-15: qty 100

## 2022-01-15 MED ORDER — LIDOCAINE HCL (CARDIAC) PF 100 MG/5ML IV SOSY
PREFILLED_SYRINGE | INTRAVENOUS | Status: DC | PRN
  Administered 2022-01-15: 100 mg via INTRAVENOUS

## 2022-01-15 MED ORDER — ALUM & MAG HYDROXIDE-SIMETH 200-200-20 MG/5ML PO SUSP
30.0000 mL | ORAL | Status: DC | PRN
  Administered 2022-01-16: 30 mL via ORAL
  Filled 2022-01-15: qty 30

## 2022-01-15 MED ORDER — BUPIVACAINE HCL (PF) 0.5 % IJ SOLN
INTRAMUSCULAR | Status: AC
  Filled 2022-01-15: qty 20

## 2022-01-15 MED ORDER — 0.9 % SODIUM CHLORIDE (POUR BTL) OPTIME
TOPICAL | Status: DC | PRN
  Administered 2022-01-15: 1000 mL

## 2022-01-15 MED ORDER — OXYCODONE HCL 5 MG PO TABS: 5.0000 mg | ORAL_TABLET | Freq: Once | ORAL | Status: DC | PRN

## 2022-01-15 MED ORDER — METOCLOPRAMIDE HCL 5 MG PO TABS: 5.0000 mg | ORAL_TABLET | Freq: Three times a day (TID) | ORAL | Status: DC | PRN

## 2022-01-15 MED ORDER — BUPIVACAINE LIPOSOME 1.3 % IJ SUSP
INTRAMUSCULAR | Status: AC
  Filled 2022-01-15: qty 20

## 2022-01-15 MED ORDER — SODIUM CHLORIDE (PF) 0.9 % IJ SOLN
INTRAMUSCULAR | Status: DC | PRN
  Administered 2022-01-15: 90 mL via INTRAMUSCULAR

## 2022-01-15 MED ORDER — MIDAZOLAM HCL 2 MG/2ML IJ SOLN
INTRAMUSCULAR | Status: AC
  Administered 2022-01-15: 1 mg via INTRAVENOUS
  Filled 2022-01-15: qty 2

## 2022-01-15 MED ORDER — MIDAZOLAM HCL 2 MG/2ML IJ SOLN
INTRAMUSCULAR | Status: AC
  Filled 2022-01-15: qty 2

## 2022-01-15 MED ORDER — ROSUVASTATIN CALCIUM 5 MG PO TABS
5.0000 mg | ORAL_TABLET | Freq: Every day | ORAL | Status: DC
  Administered 2022-01-15 – 2022-01-16 (×2): 5 mg via ORAL
  Filled 2022-01-15 (×2): qty 1

## 2022-01-15 MED ORDER — PROPOFOL 1000 MG/100ML IV EMUL
INTRAVENOUS | Status: AC
  Filled 2022-01-15: qty 100

## 2022-01-15 MED ORDER — SODIUM CHLORIDE 0.9 % IR SOLN
Status: DC | PRN
  Administered 2022-01-15: 3000 mL

## 2022-01-15 MED ORDER — HYDROCODONE-ACETAMINOPHEN 5-325 MG PO TABS
1.0000 | ORAL_TABLET | ORAL | Status: DC | PRN
  Administered 2022-01-16: 2 via ORAL
  Filled 2022-01-15: qty 2

## 2022-01-15 MED ORDER — MAGNESIUM CITRATE PO SOLN: 1.0000 | Freq: Once | ORAL | Status: DC | PRN

## 2022-01-15 MED ORDER — MORPHINE SULFATE (PF) 2 MG/ML IV SOLN: 0.5000 mg | INTRAVENOUS | Status: DC | PRN

## 2022-01-15 MED ORDER — KETAMINE HCL 50 MG/5ML IJ SOSY
PREFILLED_SYRINGE | INTRAMUSCULAR | Status: AC
  Filled 2022-01-15: qty 5

## 2022-01-15 MED ORDER — IRRISEPT - 450ML BOTTLE WITH 0.05% CHG IN STERILE WATER, USP 99.95% OPTIME
TOPICAL | Status: DC | PRN
  Administered 2022-01-15: 450 mL

## 2022-01-15 MED ORDER — MENTHOL 3 MG MT LOZG: 1.0000 | LOZENGE | OROMUCOSAL | Status: DC | PRN

## 2022-01-15 MED ORDER — ONDANSETRON HCL 4 MG PO TABS: 4.0000 mg | ORAL_TABLET | Freq: Four times a day (QID) | ORAL | Status: DC | PRN

## 2022-01-15 MED ORDER — BUPIVACAINE HCL (PF) 0.5 % IJ SOLN
INTRAMUSCULAR | Status: DC | PRN
  Administered 2022-01-15: 3 mL

## 2022-01-15 MED ORDER — DEXMEDETOMIDINE HCL IN NACL 80 MCG/20ML IV SOLN
INTRAVENOUS | Status: DC | PRN
  Administered 2022-01-15: 8 ug via BUCCAL
  Administered 2022-01-15: 4 ug via BUCCAL
  Administered 2022-01-15: 8 ug via BUCCAL

## 2022-01-15 MED ORDER — ORAL CARE MOUTH RINSE: 15.0000 mL | OROMUCOSAL | Status: DC | PRN

## 2022-01-15 MED ORDER — FENTANYL CITRATE PF 50 MCG/ML IJ SOSY
50.0000 ug | PREFILLED_SYRINGE | INTRAMUSCULAR | Status: AC | PRN
  Administered 2022-01-15: 25 ug via INTRAVENOUS
  Administered 2022-01-15: 50 ug via INTRAVENOUS
  Administered 2022-01-15: 25 ug via INTRAVENOUS

## 2022-01-15 SURGICAL SUPPLY — 59 items
APL PRP STRL LF DISP 70% ISPRP (MISCELLANEOUS) ×2
BLADE SAGITTAL AGGR TOOTH XLG (BLADE) ×1 IMPLANT
BLADE SAW SAG 25X90X1.19 (BLADE) ×1 IMPLANT
BOWL CEMENT MIX W/ADAPTER (MISCELLANEOUS) ×1 IMPLANT
BRUSH SCRUB EZ  4% CHG (MISCELLANEOUS) ×1
BRUSH SCRUB EZ 4% CHG (MISCELLANEOUS) ×1 IMPLANT
BSPLAT TIB 4 CMNT M TPR KN RT (Knees) ×1 IMPLANT
CEMENT BONE 40GM (Cement) ×2 IMPLANT
CHLORAPREP W/TINT 26 (MISCELLANEOUS) ×2 IMPLANT
COMP PATELLA GENESIS 35MM (Stem) ×1 IMPLANT
COMPONENT FEM OXINIUM RT 6N (Orthopedic Implant) IMPLANT
COMPONENT PATELLA GENESIS 35MM (Stem) IMPLANT
COMPONENT TIBIA RIGHT SZ 4 (Knees) IMPLANT
COOLER POLAR GLACIER W/PUMP (MISCELLANEOUS) ×1 IMPLANT
DRAPE 3/4 80X56 (DRAPES) ×2 IMPLANT
DRAPE INCISE IOBAN 66X60 STRL (DRAPES) ×1 IMPLANT
ELECT REM PT RETURN 9FT ADLT (ELECTROSURGICAL) ×1
ELECTRODE REM PT RTRN 9FT ADLT (ELECTROSURGICAL) ×1 IMPLANT
GAUZE PAD ABD 8X10 STRL (GAUZE/BANDAGES/DRESSINGS) ×2 IMPLANT
GAUZE SPONGE 4X4 12PLY STRL (GAUZE/BANDAGES/DRESSINGS) ×1 IMPLANT
GAUZE XEROFORM 1X8 LF (GAUZE/BANDAGES/DRESSINGS) ×1 IMPLANT
GLOVE BIO SURGEON STRL SZ8 (GLOVE) ×2 IMPLANT
GLOVE BIOGEL PI IND STRL 8.5 (GLOVE) ×2 IMPLANT
GLOVE PI ORTHO PRO STRL SZ8 (GLOVE) ×2 IMPLANT
GLOVE SURG ORTHO 8.5 STRL (GLOVE) ×1 IMPLANT
GOWN STRL REUS W/ TWL XL LVL3 (GOWN DISPOSABLE) ×2 IMPLANT
GOWN STRL REUS W/TWL XL LVL3 (GOWN DISPOSABLE) ×2
HOOD PEEL AWAY T7 (MISCELLANEOUS) ×3 IMPLANT
INSERT TIB XLPE 9 SZ 3-4 (Knees) IMPLANT
IV NS IRRIG 3000ML ARTHROMATIC (IV SOLUTION) ×1 IMPLANT
JET LAVAGE IRRISEPT WOUND (IRRIGATION / IRRIGATOR) ×1
KIT TURNOVER KIT A (KITS) ×1 IMPLANT
LAVAGE JET IRRISEPT WOUND (IRRIGATION / IRRIGATOR) IMPLANT
MANIFOLD NEPTUNE II (INSTRUMENTS) ×1 IMPLANT
MAT ABSORB  FLUID 56X50 GRAY (MISCELLANEOUS) ×1
MAT ABSORB FLUID 56X50 GRAY (MISCELLANEOUS) ×1 IMPLANT
NDL SAFETY ECLIP 18X1.5 (MISCELLANEOUS) ×1 IMPLANT
NDL SPNL 20GX3.5 QUINCKE YW (NEEDLE) ×1 IMPLANT
NEEDLE SPNL 20GX3.5 QUINCKE YW (NEEDLE) ×1 IMPLANT
NS IRRIG 1000ML POUR BTL (IV SOLUTION) ×1 IMPLANT
PACK TOTAL KNEE (MISCELLANEOUS) ×1 IMPLANT
PAD DE MAYO PRESSURE PROTECT (MISCELLANEOUS) ×1 IMPLANT
PAD WRAPON POLAR KNEE (MISCELLANEOUS) ×1 IMPLANT
PULSAVAC PLUS IRRIG FAN TIP (DISPOSABLE) ×1
STAPLER SKIN PROX 35W (STAPLE) ×1 IMPLANT
SUCTION FRAZIER HANDLE 10FR (MISCELLANEOUS) ×1
SUCTION TUBE FRAZIER 10FR DISP (MISCELLANEOUS) ×1 IMPLANT
SUT DVC 2 QUILL PDO  T11 36X36 (SUTURE) ×3
SUT DVC 2 QUILL PDO T11 36X36 (SUTURE) ×1 IMPLANT
SUT VIC AB 2-0 CT1 18 (SUTURE) ×1 IMPLANT
SUT VIC AB 2-0 CT1 27 (SUTURE)
SUT VIC AB 2-0 CT1 TAPERPNT 27 (SUTURE) IMPLANT
SUT VIC AB PLUS 45CM 1-MO-4 (SUTURE) ×1 IMPLANT
SYR 30ML LL (SYRINGE) ×3 IMPLANT
TIBIA RIGHT SZ 4 (Knees) ×1 IMPLANT
TIP FAN IRRIG PULSAVAC PLUS (DISPOSABLE) ×1 IMPLANT
TRAP FLUID SMOKE EVACUATOR (MISCELLANEOUS) ×1 IMPLANT
WATER STERILE IRR 500ML POUR (IV SOLUTION) ×1 IMPLANT
WRAPON POLAR PAD KNEE (MISCELLANEOUS) ×1

## 2022-01-15 NOTE — H&P (Signed)
The patient has been re-examined, and the chart reviewed, and there have been no interval changes to the documented history and physical.  Plan a right total knee today.  Anesthesia is consulted regarding a peripheral nerve block for post-operative pain.  The risks, benefits, and alternatives have been discussed at length, and the patient is willing to proceed.

## 2022-01-15 NOTE — Transfer of Care (Signed)
Immediate Anesthesia Transfer of Care Note  Patient: Jamie Reid  Procedure(s) Performed: TOTAL KNEE ARTHROPLASTY (Right: Knee)  Patient Location: PACU  Anesthesia Type:Spinal  Level of Consciousness: awake, alert , oriented, and patient cooperative  Airway & Oxygen Therapy: Patient Spontanous Breathing and Patient connected to face mask oxygen  Post-op Assessment: Report given to RN and Post -op Vital signs reviewed and stable  Post vital signs: Reviewed and stable  Last Vitals:  Vitals Value Taken Time  BP 94/57 01/15/22 1520  Temp 36.1 C 01/15/22 1520  Pulse 77 01/15/22 1524  Resp 17 01/15/22 1524  SpO2 99 % 01/15/22 1524  Vitals shown include unvalidated device data.  Last Pain:  Vitals:   01/15/22 1520  TempSrc:   PainSc: 0-No pain         Complications: No notable events documented.

## 2022-01-15 NOTE — Anesthesia Procedure Notes (Addendum)
Spinal  Patient location during procedure: OR Start time: 01/15/2022 1:20 PM End time: 01/15/2022 1:26 PM Reason for block: surgical anesthesia Staffing Performed: other anesthesia staff  Anesthesiologist: Ilene Qua, MD Resident/CRNA: Adalberto Ill, CRNA Other anesthesia staff: Donnie Mesa, RN Performed by: Adalberto Ill, CRNA Authorized by: Ilene Qua, MD   Preanesthetic Checklist Completed: patient identified, IV checked, site marked, risks and benefits discussed, surgical consent, monitors and equipment checked, pre-op evaluation and timeout performed Spinal Block Patient position: sitting Prep: ChloraPrep Patient monitoring: heart rate, cardiac monitor, continuous pulse ox and blood pressure Approach: midline Location: L3-4 Injection technique: single-shot Needle Needle type: Pencan  Needle gauge: 24 G Needle length: 10 cm Needle insertion depth: 8 cm Assessment Sensory level: T10 Events: CSF return Additional Notes Patient back prepped with chloraprep and allowed 3 minutes dry time. Sensorcaine MPF 0.5% confirmed by 3 RN's lot [id number] exp 01/26. Free flow CSF, no paraesthesia, no pain on injection.

## 2022-01-15 NOTE — Anesthesia Postprocedure Evaluation (Signed)
Anesthesia Post Note  Patient: Jamie Reid  Procedure(s) Performed: TOTAL KNEE ARTHROPLASTY (Right: Knee)  Patient location during evaluation: PACU Anesthesia Type: Spinal Level of consciousness: awake and alert, oriented and patient cooperative Pain management: pain level controlled Vital Signs Assessment: post-procedure vital signs reviewed and stable Respiratory status: spontaneous breathing, nonlabored ventilation and respiratory function stable Cardiovascular status: blood pressure returned to baseline and stable Postop Assessment: adequate PO intake, no headache and spinal receding Anesthetic complications: no   No notable events documented.   Last Vitals:  Vitals:   01/15/22 1754 01/15/22 1806  BP: 137/71   Pulse: (!) 57   Resp: 16   Temp:  (!) 36.2 C  SpO2: 96%     Last Pain:  Vitals:   01/15/22 1806  TempSrc: Oral  PainSc:                  Darrin Nipper

## 2022-01-15 NOTE — Op Note (Signed)
DATE OF SURGERY:  01/15/2022 TIME: 3:15 PM  PATIENT NAME:  Jamie Reid   AGE: 71 y.o.    PRE-OPERATIVE DIAGNOSIS:  M17.11 Unilateral primary osteoarthritis, right knee  POST-OPERATIVE DIAGNOSIS:  Same  PROCEDURE:  Procedure(s): TOTAL KNEE ARTHROPLASTY, RIGHT  SURGEON:  Lovell Sheehan, MD   ASSISTANT:  [name],  PA-C  OPERATIVE IMPLANTS: Tamala [employer], Cruciate Retaining Oxinium Femoral component size 6 narrow, Fixed Bearing Tray size 4, Patella polyethylene 3-peg oval button size 35 mm, with a 9 mm DISH insert.   PREOPERATIVE INDICATIONS:  Jozlin Bently is an 71 y.o. female who has a diagnosis of M17.11 Unilateral primary osteoarthritis, right knee and elected for a total knee arthroplasty after failing nonoperative treatment, including activity modification, pain medication, physical therapy and injections who has significant impairment of their activities of daily living.  Radiographs have demonstrated tricompartmental osteoarthritis joint space narrowing, osteophytes, subchondral sclerosis and cyst formation.  The risks, benefits, and alternatives were discussed at length including but not limited to the risks of infection, bleeding, nerve or blood vessel injury, knee stiffness, fracture, dislocation, loosening or failure of the hardware and the need for further surgery. Medical risks include but not limited to DVT and pulmonary embolism, myocardial infarction, stroke, pneumonia, respiratory failure and death. I discussed these risks with the patient in my office prior to the date of surgery. They understood these risks and were willing to proceed.  OPERATIVE FINDINGS AND UNIQUE ASPECTS OF THE CASE:  All three compartments with advanced and severe degenerative changes, large osteophytes and an abundance of synovial fluid. Significant deformity was also noted. A decision was made to proceed with total knee arthroplasty.   OPERATIVE DESCRIPTION:  The patient was brought to  the operative room and placed in a supine position after undergoing placement of a general anesthetic. IV antibiotics were given. Patient received tranexamic acid. The lower extremity was prepped and draped in the usual sterile fashion.  A time out was performed to verify the patient's name, date of birth, medical record number, correct site of surgery and correct procedure to be performed. The timeout was also used to confirm the patient received antibiotics and that appropriate instruments, implants and radiographs studies were available in the room.  The leg was elevated and exsanguinated with an Esmarch and the tourniquet was inflated to 250 mmHg.  A midline incision was made over the left knee.. A medial parapatellar arthrotomy was then made and the patella subluxed laterally and the knee was brought into 90 of flexion. Hoffa's fat pad along with the anterior cruciate ligament was resected and the medial joint line was exposed.  Attention was then turned to preparation of the patella. The thickness of the patella was measured with a caliper, the diameter measured with the patella templates.  The patella resection was then made with an oscillating saw using the patella cutting guide.  The 35 mm button fit appropriately.  3 peg holes for the patella component were then drilled.  The extramedullary tibial cutting guide was then placed using the anterior tibial crest and second ray of the foot as a reference.  The tibial cutting guide was adjusted to allow for appropriate posterior slope.  The tibial cutting block was pinned into position. The slotted stylus was used to measure the proximal tibial resection of 9 mm off the high lateral side. Care was taken during the tibial resection to protect the medial and collateral ligaments.  The resected tibial bone was removed.  The  distal femur was resected using the intramedullary cutting guide.  Care was taken to protect the collateral ligaments during distal  femoral resection.  The distal femoral resection was performed with an oscillating saw. The femoral cutting guide was then removed. Extension gap was measured with a 9 mm spacer block and alignment and extension was confirmed using a long alignment rod. The femur was sized to be a 6 narrow. Rotation of the referencing guide was checked with the epicondylar axis and Whitesides line. Then the 4-in-1 cutting jig was then applied to the distal femur. A stylus was used to confirm that the anterior femur would not be notched.   Then the anterior, posterior and chamfer femoral cuts were then made with an oscillating saw.  The knee was distracted and all posterior osteophytes were removed.  The flexion gap was then measured with a flexion spacer block and long alignment rod and was found to be symmetric with the extension gap and perpendicular to mechanical axis of the tibia.  The proximal tibia plateau was then sized with trial trays. The best coverage was achieved with a size 4. This tibial tray was then pinned into position. The proximal tibia was then prepared with the keel punch.  After tibial preparation was completed, all trial components were inserted with polyethylene trials. The knee achieved full extension and flexed to 120 degrees. Ligament were stable to varus and valgus at full extension as well as 30, 60 and 90 degrees of flexion.   The trials were then placed. Knee was taken through a full range of motion and deemed to be stable with the trial components. All trial components were then removed.  The joint was copiously irrigated with pulse lavage.  The final total knee arthroplasty components were then cemented into place. The knee was held in extension while cement was allowed to cure.The knee was taken through a range of motion and the patella tracked well and the knee was again irrigated copiously.  The knee capsule was then injected with Exparel.  The medial arthrotomy was closed with #1 Vicryl  and #2 Quill. The subcutaneous tissue closed with  2-0 vicryl, and skin approximated with staples.  A dry sterile and compressive dressing was applied.  A Polar Care was applied to the operative knee.  The patient was awakened and brought to the PACU in stable and satisfactory condition.  All sharp, lap and instrument counts were correct at the conclusion the case. I spoke with the patient's family in the postop consultation room to let them know the case had been performed without complication and the patient was stable in recovery room.   Total tourniquet time was 53 minutes.

## 2022-01-15 NOTE — Anesthesia Procedure Notes (Signed)
Anesthesia Regional Block: Adductor canal block   Pre-Anesthetic Checklist: , timeout performed,  Correct Patient, Correct Site, Correct Laterality,  Correct Procedure, Correct Position, site marked,  Risks and benefits discussed,  Surgical consent,  Pre-op evaluation,  At surgeon's request and post-op pain management  Laterality: Lower and Right  Prep: chloraprep       Needles:  Injection technique: Single-shot  Needle Type: Echogenic Needle     Needle Length: 9cm  Needle Gauge: 21     Additional Needles:   Procedures:,,,, ultrasound used (permanent image in chart),,    Narrative:  Start time: 01/15/2022 12:40 PM End time: 01/15/2022 12:43 PM Injection made incrementally with aspirations every 5 mL.  Performed by: Personally  Anesthesiologist: Ilene Qua, MD  Additional Notes: Patient's chart reviewed and they were deemed appropriate candidate for procedure, per surgeon's request. Patient educated about risks, benefits, and alternatives of the block including but not limited to: temporary or permanent nerve damage, bleeding, infection, damage to surround tissues, block failure, local anesthetic toxicity. Patient expressed understanding. A formal time-out was conducted consistent with institution rules.  Monitors were applied, and minimal sedation used (see nursing record). The site was prepped with skin prep and allowed to dry, and sterile gloves were used. A high frequency linear ultrasound probe with probe cover was utilized throughout. Femoral artery visualized at mid-thigh level, local anesthetic injected anterolateral to it, and echogenic block needle trajectory was monitored throughout. Hydrodissection of saphenous nerve visualized and appeared anatomically normal. Aspiration performed every 34m. Blood vessels were avoided. All injections were performed without resistance and free of blood and paresthesias. The patient tolerated the procedure well.  Injectate: 20cc  0.5% bupivacaine

## 2022-01-15 NOTE — Anesthesia Preprocedure Evaluation (Addendum)
Anesthesia Evaluation  Patient identified by MRN, date of birth, ID band Patient awake    Reviewed: Allergy & Precautions, NPO status , Patient's Chart, lab work & pertinent test results  History of Anesthesia Complications Negative for: history of anesthetic complications  Airway Mallampati: III  TM Distance: >3 FB Neck ROM: full    Dental  (+) Teeth Intact   Pulmonary neg pulmonary ROS   Pulmonary exam normal        Cardiovascular hypertension, On Medications Normal cardiovascular exam     Neuro/Psych negative neurological ROS  negative psych ROS   GI/Hepatic Neg liver ROS, hiatal hernia, PUD,GERD  ,,  Endo/Other  negative endocrine ROS    Renal/GU      Musculoskeletal   Abdominal   Peds  Hematology negative hematology ROS (+)   Anesthesia Other Findings Past Medical History: No date: Actinic keratosis No date: Adiposity No date: Arthritis 11/16/2018: Basal cell carcinoma     Comment:  Right lat. mid back. Superficial and nodular patterns.               EDC 12/14/2018 No date: 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[place]) No date: Family history of adverse reaction to anesthesia     Comment:  mother hallucinated No date: GERD (gastroesophageal reflux disease) No date: History of hiatal hernia No date: Hyperlipidemia No date: Hypertension No date: Osteopenia No date: Ulcerative colitis [facility])  Past Surgical History: No date: ABDOMINAL HYSTERECTOMY     Comment:  with bladder tuck 03/22/2021: CATARACT EXTRACTION W/PHACO; Right     Comment:  Procedure: CATARACT EXTRACTION PHACO AND INTRAOCULAR               LENS PLACEMENT ([place]) RIGHT 7.94 01:06.4;  Surgeon:               Leandrew Koyanagi, MD;  Location: [facility] ORS;  Service:               Ophthalmology;  Laterality: Right; No date: CHOLECYSTECTOMY No date: COLONOSCOPY No date: EYE SURGERY     Comment:  cataract surgery No date: FRACTURE SURGERY; Right     Comment:  lower  leg No date: GALLBLADDER SURGERY 06/10/2017: TOTAL KNEE ARTHROPLASTY; Left     Comment:  Procedure: TOTAL KNEE ARTHROPLASTY;  Surgeon: Hessie Knows, MD;  Location: [facility] ORS;  Service: Orthopedics;               Laterality: Left;  BMI    Body Mass Index: 34.39 kg/m      Reproductive/Obstetrics negative OB ROS                             Anesthesia Physical Anesthesia Plan  ASA: 2  Anesthesia Plan: Spinal   Post-op Pain Management: Regional block* and Toradol IV (intra-op)*   Induction: Intravenous  PONV Risk Score and Plan: 2 and Propofol infusion and TIVA  Airway Management Planned: Natural Airway and Nasal Cannula  Additional Equipment:   Intra-op Plan:   Post-operative Plan:   Informed Consent: I have reviewed the patients History and Physical, chart, labs and discussed the procedure including the risks, benefits and alternatives for the proposed anesthesia with the patient or authorized representative who has indicated his/her understanding and acceptance.     Dental Advisory Given  Plan Discussed with: Anesthesiologist, CRNA and Surgeon  Anesthesia Plan Comments: (Patient reports no bleeding problems and  no anticoagulant use.  Plan for spinal with backup GA  Patient consented for risks of anesthesia including but not limited to:  - adverse reactions to medications - damage to eyes, teeth, lips or other oral mucosa - nerve damage due to positioning  - risk of bleeding, infection and or nerve damage from spinal that could lead to paralysis - risk of headache or failed spinal - damage to teeth, lips or other oral mucosa - sore throat or hoarseness - damage to heart, brain, nerves, lungs, other parts of body or loss of life  Patient voiced understanding.)        Anesthesia Quick Evaluation

## 2022-01-16 ENCOUNTER — Encounter: Payer: Self-pay | Admitting: Orthopedic Surgery

## 2022-01-16 DIAGNOSIS — M1711 Unilateral primary osteoarthritis, right knee: Secondary | ICD-10-CM | POA: Diagnosis not present

## 2022-01-16 MED ORDER — HYDROMORPHONE HCL 2 MG PO TABS
2.0000 mg | ORAL_TABLET | ORAL | Status: DC | PRN
  Administered 2022-01-16: 2 mg via ORAL
  Filled 2022-01-16 (×2): qty 1

## 2022-01-16 MED ORDER — ONDANSETRON HCL 4 MG PO TABS: 4.0000 mg | ORAL_TABLET | Freq: Four times a day (QID) | ORAL | 0 refills | Status: AC | PRN

## 2022-01-16 MED ORDER — SODIUM CHLORIDE 0.9 % IV SOLN
12.5000 mg | Freq: Four times a day (QID) | INTRAVENOUS | Status: DC | PRN
  Administered 2022-01-16: 12.5 mg via INTRAVENOUS
  Filled 2022-01-16: qty 0.5

## 2022-01-16 MED ORDER — HYDROMORPHONE HCL 2 MG PO TABS: 2.0000 mg | ORAL_TABLET | ORAL | 0 refills | Status: AC | PRN

## 2022-01-16 MED ORDER — ASPIRIN 81 MG PO TBEC: 81.0000 mg | DELAYED_RELEASE_TABLET | Freq: Every day | ORAL | 12 refills | Status: AC

## 2022-01-16 MED ORDER — ACETAMINOPHEN 10 MG/ML IV SOLN
1000.0000 mg | Freq: Once | INTRAVENOUS | Status: AC
  Administered 2022-01-16: 1000 mg via INTRAVENOUS
  Filled 2022-01-16: qty 100

## 2022-01-16 MED ORDER — DOCUSATE SODIUM 100 MG PO CAPS: 100.0000 mg | ORAL_CAPSULE | Freq: Two times a day (BID) | ORAL | 0 refills | Status: AC

## 2022-01-16 MED ORDER — METHOCARBAMOL 500 MG PO TABS: 500.0000 mg | ORAL_TABLET | Freq: Three times a day (TID) | ORAL | 1 refills | Status: AC | PRN

## 2022-01-16 NOTE — Progress Notes (Signed)
Physical Therapy Treatment Patient Details Name: Jamie Reid MRN: [id number] DOB: 1950-06-11 Today's Date: 01/16/2022   History of Present Illness 71 yo female s/p R TKA. PMH includes HTN, hiatal hernia, L TKA, Hyperlipidemia, and L TKA.    PT Comments    Pt found supine in bed upon PT entry with c/o N/V and 7/10 pain in R knee s/p TKA. Pt agreeable to bed level exercises and tolerated therex well. Pt educated on importance of heel prop for knee extension. Pt would benefit from skilled physical therapy to address the listed deficits (see below) to increase independence with ADLs and function. Current recommendation is HHPT to return pt to PLOF.    Recommendations for follow up therapy are one component of a multi-disciplinary discharge planning process, led by the attending physician.  Recommendations may be updated based on patient status, additional functional criteria and insurance authorization.  Follow Up Recommendations  Home health PT     Assistance Recommended at Discharge Intermittent Supervision/Assistance  Patient can return home with the following A little help with walking and/or transfers;A little help with bathing/dressing/bathroom;Assistance with cooking/housework;Assist for transportation;Help with stairs or ramp for entrance   Equipment Recommendations  None recommended by PT    Recommendations for Other Services       Precautions / Restrictions Precautions Precautions: Knee Precaution Booklet Issued: Yes (comment) Restrictions Weight Bearing Restrictions: Yes RLE Weight Bearing: Weight bearing as tolerated     Mobility  Bed Mobility Overal bed mobility: Needs Assistance Bed Mobility: Supine to Sit     Supine to sit: Supervision          Transfers Overall transfer level: Needs assistance Equipment used: Rolling walker (2 wheels) Transfers: Sit to/from Stand, Bed to chair/wheelchair/BSC Sit to Stand: Min guard   Step pivot transfers: Min  guard            Ambulation/Gait                   Stairs             Wheelchair Mobility    Modified Rankin (Stroke Patients Only)       Balance Overall balance assessment: Needs assistance Sitting-balance support: Feet supported Sitting balance-Leahy Scale: Fair     Standing balance support: Bilateral upper extremity supported, Reliant on assistive device for balance Standing balance-Leahy Scale: Fair                              Cognition Arousal/Alertness: Awake/alert Behavior During Therapy: WFL for tasks assessed/performed Overall Cognitive Status: Within Functional Limits for tasks assessed                                 General Comments: pt feeling N/V        Exercises Total Joint Exercises Ankle Circles/Pumps: AROM, Both, 10 reps, Supine Quad Sets: AROM, Right, 10 reps, Supine Heel Slides: AROM, 10 reps, Right, Supine Hip ABduction/ADduction: AROM, Right, 10 reps Goniometric ROM: flexion approx 70 degrees. extension approx -10 degrees    General Comments        Pertinent Vitals/Pain Pain Assessment Pain Assessment: 0-10 Pain Score: 7  Pain Location: R knee Pain Descriptors / Indicators: Aching Pain Intervention(s): Limited activity within patient's tolerance, Monitored during session    Home Living Family/patient expects to be discharged to:: Private residence Living Arrangements: Spouse/significant other Available Help at  Discharge: Family Type of Home: House Home Access: Stairs to enter Entrance Stairs-Rails: Right;Left;Can reach both Entrance Stairs-Number of Steps: 2 Alternate Level Stairs-Number of Steps: 13 Home Layout: Two level Home Equipment: [place] (2 wheels);Crutches;Shower seat;Toilet riser;Adaptive equipment      Prior Function            PT Goals (current goals can now be found in the care plan section) Acute Rehab PT Goals Patient Stated Goal: to return  home and control pain PT Goal Formulation: With patient Time For Goal Achievement: 01/30/22 Potential to Achieve Goals: Good Progress towards PT goals: Progressing toward goals    Frequency    BID      PT Plan Current plan remains appropriate    Co-evaluation              AM-PAC PT "6 Clicks" Mobility   Outcome Measure  Help needed turning from your back to your side while in a flat bed without using bedrails?: None Help needed moving from lying on your back to sitting on the side of a flat bed without using bedrails?: None Help needed moving to and from a bed to a chair (including a wheelchair)?: A Little Help needed standing up from a chair using your arms (e.g., wheelchair or bedside chair)?: A Little Help needed to walk in hospital room?: A Little Help needed climbing 3-5 steps with a railing? : A Little 6 Click Score: 20    End of Session Equipment Utilized During Treatment: Other (comment) (none) Activity Tolerance: Other (comment) Patient left: in bed;with call bell/phone within reach;with bed alarm set;with SCD's reapplied Nurse Communication: Mobility status PT Visit Diagnosis: Unsteadiness on feet (R26.81);Other abnormalities of gait and mobility (R26.89);Muscle weakness (generalized) (M62.81);Difficulty in walking, not elsewhere classified (R26.2);Pain Pain - Right/Left: Right Pain - part of body: Knee     Time: 6144-3154 PT Time Calculation (min) (ACUTE ONLY): 9 min  Charges:                        Aletha Halim, SPT  01/16/2022, 3:41 PM

## 2022-01-16 NOTE — Plan of Care (Signed)
  Problem: Education: Goal: Knowledge of the prescribed therapeutic regimen will improve Outcome: Not Progressing Goal: Individualized Educational Video(s) Outcome: Not Progressing   Problem: Activity: Goal: Ability to avoid complications of mobility impairment will improve Outcome: Not Progressing Goal: Range of joint motion will improve Outcome: Not Progressing   Problem: Clinical Measurements: Goal: Postoperative complications will be avoided or minimized Outcome: Not Progressing   Problem: Pain Management: Goal: Pain level will decrease with appropriate interventions Outcome: Not Progressing   Problem: Skin Integrity: Goal: Will show signs of wound healing Outcome: Not Progressing   Problem: Education: Goal: Knowledge of General Education information will improve Description: Including pain rating scale, medication(s)/side effects and non-pharmacologic comfort measures Outcome: Not Progressing   Problem: Health Behavior/Discharge Planning: Goal: Ability to manage health-related needs will improve Outcome: Not Progressing   Problem: Clinical Measurements: Goal: Ability to maintain clinical measurements within normal limits will improve Outcome: Not Progressing Goal: Will remain free from infection Outcome: Not Progressing Goal: Diagnostic test results will improve Outcome: Not Progressing Goal: Respiratory complications will improve Outcome: Not Progressing Goal: Cardiovascular complication will be avoided Outcome: Not Progressing   Problem: Activity: Goal: Risk for activity intolerance will decrease Outcome: Not Progressing   Problem: Nutrition: Goal: Adequate nutrition will be maintained Outcome: Not Progressing   Problem: Coping: Goal: Level of anxiety will decrease Outcome: Not Progressing   Problem: Elimination: Goal: Will not experience complications related to bowel motility Outcome: Not Progressing Goal: Will not experience complications related  to urinary retention Outcome: Not Progressing   Problem: Pain Managment: Goal: General experience of comfort will improve Outcome: Not Progressing   Problem: Safety: Goal: Ability to remain free from injury will improve Outcome: Not Progressing   Problem: Skin Integrity: Goal: Risk for impaired skin integrity will decrease Outcome: Not Progressing

## 2022-01-16 NOTE — Care Management Obs Status (Signed)
[place] NOTIFICATION   Patient Details  Name: Jamie Reid MRN: [id number] Date of Birth: November 02, 1950   Medicare Observation Status Notification Given:  Yes    Conception Oms, RN 01/16/2022, 9:07 AM

## 2022-01-16 NOTE — Discharge Instructions (Signed)
Continue weight bear as tolerated on the right lower extremity.    Elevate the right lower extremity whenever possible and continue the polar care while elevating the extremity. Patient may shower. No bath or submerging the wound.    Take aspirin 81 mg once daily for 30 days as directed for blood clot prevention.  Continue to work on knee range of motion exercises at home as instructed by physical therapy. Continue to use a walker for assistance with ambulation until cleared by physical therapy.  Call [phone] with any questions, such as fever > 101.5 degrees, drainage from the wound or shortness of breath.

## 2022-01-16 NOTE — Evaluation (Signed)
Occupational Therapy Evaluation Patient Details Name: Jamie Reid MRN: [id number] DOB: 1950/05/02 Today's Date: 01/16/2022   History of Present Illness 71 yo female s/p R TKA. PMH includes HTN, hiatal hernia, L TKA, Hyperlipidemia, and L TKA.   Clinical Impression   Pt seen for OT evaluation this date, POD#1 from above surgery. Pt was independent in all ADL prior to surgery, however limited in gardening due to R knee pain. Pt is eager to return to PLOF with less pain and improved safety and independence. Pt currently requires minimal assist for LB dressing while in seated position due to pain and limited AROM of R knee. Pt endorsing significant pain in R knee not well controlled by recent pain medication and endorses fatigue from limited sleep last night and several bouts of nausea/vomiting overnight. Pt instructed in polar care mgt, falls prevention strategies, home/routines modifications, and DME/AE for LB bathing and dressing tasks. Pt verbalized understanding. Handout provided to support recall and carryover. Pt would benefit from skilled OT services while hospitalized including additional instruction in dressing techniques with or without assistive devices for dressing and bathing skills to support recall and carryover prior to discharge and ultimately to maximize safety, independence, and minimize falls risk and caregiver burden. Do not currently anticipate any OT needs following this hospitalization.     Recommendations for follow up therapy are one component of a multi-disciplinary discharge planning process, led by the attending physician.  Recommendations may be updated based on patient status, additional functional criteria and insurance authorization.   Follow Up Recommendations  No OT follow up     Assistance Recommended at Discharge PRN  Patient can return home with the following A little help with walking and/or transfers;A little help with bathing/dressing/bathroom;Assistance  with cooking/housework;Assist for transportation;Help with stairs or ramp for entrance    Functional Status Assessment  Patient has had a recent decline in their functional status and demonstrates the ability to make significant improvements in function in a reasonable and predictable amount of time.  Equipment Recommendations  None recommended by OT    Recommendations for Other Services       Precautions / Restrictions Precautions Precaution Booklet Issued: Yes (comment) Restrictions Weight Bearing Restrictions: Yes RLE Weight Bearing: Weight bearing as tolerated      Mobility Bed Mobility               General bed mobility comments: deferred, pt declining 2/2 pain, fatigue    Transfers                   General transfer comment: deferred, pt declining 2/2 pain, fatigue      Balance                                           ADL either performed or assessed with clinical judgement   ADL                                         General ADL Comments: Pt currently requires MIN A for LB ADL tasks 2/2 decr strength, ROM, and pain in R knee     Vision         Perception     Praxis      Pertinent Vitals/Pain Pain Assessment Pain Assessment: 0-10  Pain Location: R knee Pain Intervention(s): Limited activity within patient's tolerance, Premedicated before session, Ice applied     Hand Dominance     Extremity/Trunk Assessment Upper Extremity Assessment Upper Extremity Assessment: Overall WFL for tasks assessed   Lower Extremity Assessment Lower Extremity Assessment: RLE deficits/detail RLE Deficits / Details: expected post-op strength/ROM deficits   Cervical / Trunk Assessment Cervical / Trunk Assessment: Normal   Communication Communication Communication: No difficulties   Cognition Arousal/Alertness: Awake/alert Behavior During Therapy: WFL for tasks assessed/performed Overall Cognitive Status: Within  Functional Limits for tasks assessed                                 General Comments: pt reporting feeling fatigued     General Comments       Exercises Other Exercises Other Exercises: Pt educated in polar care mgt, falls prevention, AE/DME, ADL training, and home/routines modifications to maximize safety/indep. Handout provided to support recall and carryover.   Shoulder Instructions      Home Living Family/patient expects to be discharged to:: Private residence Living Arrangements: Spouse/significant other Available Help at Discharge: Family Type of Home: House Home Access: Stairs to enter Technical brewer of Steps: 2 Entrance Stairs-Rails: Right;Left;Can reach both Home Layout: Two level Alternate Level Stairs-Number of Steps: 13 Alternate Level Stairs-Rails: Right;Left;Can reach both (last couple steps only railing on the L) Bathroom Shower/Tub: Astronomer Accessibility: Yes   Home Equipment: Many (2 wheels);Crutches;Shower seat;Toilet riser;Adaptive equipment Adaptive Equipment: [place];Long-handled shoe horn        Prior Functioning/Environment Prior Level of Function : Independent/Modified Independent               ADLs Comments: gardening more limited recently 2/2 R knee pain but otherwise was independent        OT Problem List: Decreased strength;Decreased range of motion      OT Treatment/Interventions: Self-care/ADL training;Therapeutic exercise;Therapeutic activities;DME and/or AE instruction;Patient/family education;Balance training    OT Goals(Current goals can be found in the care plan section) Acute Rehab OT Goals Patient Stated Goal: have less pain and then go home OT Goal Formulation: With patient Time For Goal Achievement: 01/30/22 Potential to Achieve Goals: Good ADL Goals Pt Will Perform Lower Body Dressing: with adaptive equipment;sit to/from stand Pt Will Transfer to  Toilet: with modified independence;ambulating (LRAD) Pt Will Perform Toileting - Clothing Manipulation and hygiene: with modified independence Additional ADL Goal #1: Pt will independently instruct family in polar care mgt  OT Frequency: Min 1X/week    Co-evaluation              AM-PAC OT "6 Clicks" Daily Activity     Outcome Measure Help from another person eating meals?: None Help from another person taking care of personal grooming?: None Help from another person toileting, which includes using toliet, bedpan, or urinal?: A Little Help from another person bathing (including washing, rinsing, drying)?: A Little Help from another person to put on and taking off regular upper body clothing?: None Help from another person to put on and taking off regular lower body clothing?: A Little 6 Click Score: 21   End of Session Nurse Communication: Patient requests pain meds  Activity Tolerance: Patient limited by fatigue;Patient limited by pain Patient left: in bed;with call bell/phone within reach;with bed alarm set;Other (comment) (polar care in place)  OT Visit Diagnosis: Other abnormalities of gait and mobility (  R26.89);Pain Pain - Right/Left: Right Pain - part of body: Knee                Time: 2589-4834 OT Time Calculation (min): 10 min Charges:  OT General Charges $OT Visit: 1 Visit OT Evaluation $OT Eval Low Complexity: 1 Low  Ardeth Perfect., MPH, MS, OTR/L ascom [phone] 01/16/22, 2:05 PM

## 2022-01-16 NOTE — Evaluation (Signed)
Physical Therapy Evaluation Patient Details Name: Jamie Reid MRN: [id number] DOB: August 10, 1950 Today's Date: 01/16/2022  History of Present Illness  71 yo female s/p R TKA. PMH includes HTN, hiatal hernia, L TKA, Hyperlipidemia, and L TKA.  Clinical Impression  Pt found supine in bed upon PT entry with c/o 7/10 pain in R knee s/p TKA and N/V. Pt required supervision with bed mobility. Sit<>Stand with RW and CGA. Step pivot transfer to [facility] and chair with CGA and RW. Pt N/V continued throughout mobility session. Pt able to tolerate seated exercises. Pt would benefit from skilled physical therapy to address the listed deficits (see below) to increase independence with ADLs and function. Current recommendation is HHPT to return pt to PLOF.          Recommendations for follow up therapy are one component of a multi-disciplinary discharge planning process, led by the attending physician.  Recommendations may be updated based on patient status, additional functional criteria and insurance authorization.  Follow Up Recommendations Home health PT      Assistance Recommended at Discharge Intermittent Supervision/Assistance  Patient can return home with the following  A little help with walking and/or transfers;A little help with bathing/dressing/bathroom;Assistance with cooking/housework;Assist for transportation;Help with stairs or ramp for entrance    Equipment Recommendations None recommended by PT  Recommendations for Other Services  OT consult    Functional Status Assessment Patient has had a recent decline in their functional status and demonstrates the ability to make significant improvements in function in a reasonable and predictable amount of time.     Precautions / Restrictions Precautions Precautions: Knee Precaution Booklet Issued: Yes (comment) Restrictions Weight Bearing Restrictions: Yes RLE Weight Bearing: Weight bearing as tolerated      Mobility  Bed  Mobility Overal bed mobility: Needs Assistance Bed Mobility: Supine to Sit     Supine to sit: Supervision          Transfers Overall transfer level: Needs assistance Equipment used: Rolling walker (2 wheels) Transfers: Sit to/from Stand, Bed to chair/wheelchair/BSC Sit to Stand: Min guard   Step pivot transfers: Min guard            Ambulation/Gait                  Stairs            Wheelchair Mobility    Modified Rankin (Stroke Patients Only)       Balance Overall balance assessment: Needs assistance Sitting-balance support: Feet supported Sitting balance-Leahy Scale: Fair     Standing balance support: Bilateral upper extremity supported, Reliant on assistive device for balance Standing balance-Leahy Scale: Fair                               Pertinent Vitals/Pain Pain Assessment Pain Assessment: 0-10 Pain Score: 7  Pain Location: R knee Pain Descriptors / Indicators: Aching Pain Intervention(s): Limited activity within patient's tolerance, Monitored during session, Premedicated before session, Ice applied, Repositioned    Home Living Family/patient expects to be discharged to:: Private residence Living Arrangements: Spouse/significant other Available Help at Discharge: Family Type of Home: House Home Access: Stairs to enter Entrance Stairs-Rails: Right;Left;Can reach both Entrance Stairs-Number of Steps: 2 Alternate Level Stairs-Number of Steps: 13 Home Layout: Two level Home Equipment: [place] (2 wheels);Crutches;Shower seat;Toilet riser      Prior Function Prior Level of Function : Independent/Modified Independent  Hand Dominance        Extremity/Trunk Assessment   Upper Extremity Assessment Upper Extremity Assessment: Overall WFL for tasks assessed    Lower Extremity Assessment Lower Extremity Assessment: Generalized weakness    Cervical / Trunk  Assessment Cervical / Trunk Assessment: Normal  Communication   Communication: No difficulties  Cognition Arousal/Alertness: Awake/alert Behavior During Therapy: WFL for tasks assessed/performed Overall Cognitive Status: Within Functional Limits for tasks assessed                                          General Comments      Exercises Total Joint Exercises Ankle Circles/Pumps: AROM, Both, 10 reps, Seated Quad Sets: Seated, AROM, Right, 10 reps Hip ABduction/ADduction: AROM, Right, 10 reps Goniometric ROM: flexion approx 70 degrees. extension approx -10 degrees   Assessment/Plan    PT Assessment Patient needs continued PT services  PT Problem List Decreased strength;Decreased balance;Pain;Decreased range of motion;Decreased mobility;Decreased activity tolerance       PT Treatment Interventions DME instruction;Functional mobility training;Balance training;Patient/family education;Gait training;Neuromuscular re-education;Therapeutic activities;Stair training;Therapeutic exercise    PT Goals (Current goals can be found in the Care Plan section)  Acute Rehab PT Goals Patient Stated Goal: to return home and control pain PT Goal Formulation: With patient Time For Goal Achievement: 01/30/22 Potential to Achieve Goals: Good    Frequency BID     Co-evaluation               AM-PAC PT "6 Clicks" Mobility  Outcome Measure Help needed turning from your back to your side while in a flat bed without using bedrails?: None Help needed moving from lying on your back to sitting on the side of a flat bed without using bedrails?: None Help needed moving to and from a bed to a chair (including a wheelchair)?: A Little Help needed standing up from a chair using your arms (e.g., wheelchair or bedside chair)?: A Little Help needed to walk in hospital room?: A Little Help needed climbing 3-5 steps with a railing? : A Little 6 Click Score: 20    End of Session  Equipment Utilized During Treatment: Gait belt Activity Tolerance: Other (comment) (limited by N/V) Patient left: in chair;with call bell/phone within reach;with SCD's reapplied Nurse Communication: Mobility status;Other (comment) (communicated N/V) PT Visit Diagnosis: Unsteadiness on feet (R26.81);Other abnormalities of gait and mobility (R26.89);Muscle weakness (generalized) (M62.81);Difficulty in walking, not elsewhere classified (R26.2);Pain Pain - Right/Left: Right Pain - part of body: Knee    Time: 7026-3785 PT Time Calculation (min) (ACUTE ONLY): 29 min   Charges:             Claiborne Billings O'Daniel, SPT  01/16/2022, 9:29 AM

## 2022-01-16 NOTE — Discharge Summary (Signed)
Physician Discharge Summary  Patient ID: Jamie Reid MRN: [id number] DOB/AGE: [birth year] 73 y.o.  Admit date: 01/15/2022 Discharge date:01/17/22  Admission Diagnoses:  M17.11 Unilateral primary osteoarthritis, right knee S/P TKR (total knee replacement) using cement, right  Discharge Diagnoses:  M17.11 Unilateral primary osteoarthritis, right knee Principal Problem:   S/P TKR (total knee replacement) using cement, right   Past Medical History:  Diagnosis Date   Actinic keratosis    Adiposity    Arthritis    Basal cell carcinoma 11/16/2018   Right lat. mid back. Superficial and nodular patterns. 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[place])    Family history of adverse reaction to anesthesia    mother hallucinated   GERD (gastroesophageal reflux disease)    History of hiatal hernia    Hyperlipidemia    Hypertension    Osteopenia    Ulcerative colitis ([place])     Surgeries: Procedure(s): TOTAL KNEE ARTHROPLASTY on 01/15/2022   Consultants (if any):   Discharged Condition: Improved  Hospital Course: Michaeline Eckersley is an 71 y.o. female who was admitted 01/15/2022 with a diagnosis of  M17.11 Unilateral primary osteoarthritis, right knee S/P TKR (total knee replacement) using cement, right and went to the operating room on 01/15/2022 and underwent the above named procedures.    She was given perioperative antibiotics:  Anti-infectives (From admission, onward)    Start     Dose/Rate Route Frequency Ordered Stop   01/15/22 1930  ceFAZolin (ANCEF) IVPB 2g/100 mL premix        2 g 200 mL/hr over 30 Minutes Intravenous Every 6 hours 01/15/22 1752 01/16/22 0150   01/15/22 1026  ceFAZolin (ANCEF) 2-4 GM/100ML-% IVPB       Note to Pharmacy: Jeanene Erb E: cabinet override      01/15/22 1026 01/15/22 1342   01/15/22 0600  ceFAZolin (ANCEF) IVPB 2g/100 mL premix        2 g 200 mL/hr over 30 Minutes Intravenous On call to O.R. 01/14/22 2154 01/15/22 1402     .  She was given  sequential compression devices, early ambulation, and aspirin 81 mg once daily for 30 days for DVT prophylaxis.  She benefited maximally from the hospital stay and there were no complications.    Recent vital signs:  Vitals:   01/16/22 0515 01/16/22 0754  BP: (!) 117/58 (!) 116/58  Pulse: 72 78  Resp: 20 16  Temp: 98.1 F (36.7 C) 98.5 F (36.9 C)  SpO2: 96% 99%    Recent laboratory studies:  Lab Results  Component Value Date   HGB 13.4 01/15/2022   HGB 14.1 12/13/2021   HGB 12.6 06/11/2017   Lab Results  Component Value Date   WBC 16.6 (H) 01/15/2022   PLT 202 01/15/2022   Lab Results  Component Value Date   INR 0.93 05/28/2017   Lab Results  Component Value Date   NA 137 12/13/2021   K 4.4 12/13/2021   CL 102 12/13/2021   CO2 27 12/13/2021   BUN 19 12/13/2021   CREATININE 0.61 01/15/2022   GLUCOSE 93 12/13/2021    Discharge Medications:   Allergies as of 01/16/2022       Reactions   Calcium    Due to UC   Codeine Nausea Only   dizzy   Latex Hives   Nsaids Other (See Comments)   Aggravate ulcerative colitis   Sulfa Antibiotics Other (See Comments)   Unknown   Tramadol Nausea Only  Medication List     TAKE these medications    acetaminophen 500 MG tablet Commonly known as: TYLENOL Take 500 mg by mouth every 4 (four) hours as needed for moderate pain.   aspirin EC 81 MG tablet Take 1 tablet (81 mg total) by mouth daily. Swallow whole.   cyanocobalamin 1000 MCG tablet Take 1,000 mcg by mouth daily.   docusate sodium 100 MG capsule Commonly known as: COLACE Take 1 capsule (100 mg total) by mouth 2 (two) times daily.   hydrocortisone 2.5 % cream Apply to rash under breasts 1-2 times daily as needed for itch.   HYDROmorphone 2 MG tablet Commonly known as: DILAUDID Take 1 tablet (2 mg total) by mouth every 4 (four) hours as needed for moderate pain.   ketoconazole 2 % cream Commonly known as: NIZORAL Apply to rash under breasts  1-2 times daily as needed for rash.   losartan 25 MG tablet Commonly known as: COZAAR Take 25 mg by mouth daily.   Mesalamine 800 MG Tbec Take 800 mg by mouth daily.   methocarbamol 500 MG tablet Commonly known as: ROBAXIN Take 1 tablet (500 mg total) by mouth every 8 (eight) hours as needed for muscle spasms.   ondansetron 4 MG tablet Commonly known as: ZOFRAN Take 1 tablet (4 mg total) by mouth every 6 (six) hours as needed for nausea or vomiting.   pantoprazole 20 MG tablet Commonly known as: PROTONIX Take 20 mg by mouth daily.   rosuvastatin 5 MG tablet Commonly known as: CRESTOR Take 5 mg by mouth at bedtime.   triamcinolone cream 0.1 % Commonly known as: KENALOG Apply to itchy rash on back 1-2 times daily until improved. Avoid face, groin, underarms.   Vitamin D 50 MCG (2000 UT) tablet Take 2,000 Units by mouth daily.               Durable Medical Equipment  (From admission, onward)           Start     Ordered   01/16/22 0820  For home use only DME 3 n 1  Once        01/16/22 0820   01/16/22 0820  For home use only DME Walker rolling  Once       Question Answer Comment  Walker: With 5 Inch Wheels   Patient needs a walker to treat with the following condition Osteoarthritis of right knee      01/16/22 0820            Diagnostic Studies: [place] OR NERVE BLOCK-IMAGE ONLY [facility])  Result Date: 01/15/2022 There is no interpretation for this exam.  This order is for images obtained during a surgical procedure.  Please See "Surgeries" Tab for more information regarding the procedure.    Disposition: Discharge disposition: 01-Home or Self Care            Signed: [name] ,PA-C 01/16/2022, 8:21 AM

## 2022-01-16 NOTE — Progress Notes (Signed)
Spoke with the patient She lives at home with her husband He will provide transportation She has DME at home and does not need additional She is set up with [facility] for [place] reviewed verbally She stated that she has no other needs

## 2022-01-16 NOTE — Plan of Care (Signed)

## 2022-01-16 NOTE — Progress Notes (Signed)
  Subjective:  Patient reports pain as moderate.  N/V probably from hydrocodone.  Objective:   VITALS:   Vitals:   01/15/22 2024 01/15/22 2354 01/16/22 0515 01/16/22 0754  BP: 134/66 125/62 (!) 117/58 (!) 116/58  Pulse: 68 74 72 78  Resp: _0 Temp: 97.8 F (36.6 C) 98.3 F (36.8 C) 98.1 F (36.7 C) 98.5 F (36.9 C)  TempSrc:      SpO2: 99% 99% 96% 99%  Weight:      Height:        PHYSICAL EXAM:  Neurologically intact ABD soft Neurovascular intact Sensation intact distally Intact pulses distally Dorsiflexion/Plantar flexion intact Incision: dressing C/D/I No cellulitis present Compartment soft  LABS  Results for orders placed or performed during the hospital encounter of 01/15/22 (from the past 24 hour(s))  Type and screen [place]     Status: None   Collection Time: 01/15/22 10:36 AM  Result Value Ref Range   ABO/RH(D) O POS    Antibody Screen NEG    Sample Expiration      01/18/2022,2359 Performed at [facility], [address] [place], [place] [address]   Glucose, capillary     Status: None   Collection Time: 01/15/22 10:37 AM  Result Value Ref Range   Glucose-Capillary 85 70 - 99 mg/dL  CBC     Status: Abnormal   Collection Time: 01/15/22  6:23 PM  Result Value Ref Range   WBC 16.6 (H) 4.0 - 10.5 K/uL   RBC 4.52 3.87 - 5.11 MIL/uL   Hemoglobin 13.4 12.0 - 15.0 g/dL   HCT 40.9 36.0 - 46.0 %   MCV 90.5 80.0 - 100.0 fL   MCH 29.6 26.0 - 34.0 pg   MCHC 32.8 30.0 - 36.0 g/dL   RDW 12.8 11.5 - 15.5 %   Platelets 202 150 - 400 K/uL   nRBC 0.0 0.0 - 0.2 %  Creatinine, serum     Status: None   Collection Time: 01/15/22  6:23 PM  Result Value Ref Range   Creatinine, Ser 0.61 0.44 - 1.00 mg/dL   GFR, Estimated >60 >60 mL/min    [place] OR NERVE BLOCK-IMAGE ONLY [facility])  Result Date: 01/15/2022 There is no interpretation for this exam.  This order is for images obtained during a surgical procedure.  Please See  "Surgeries" Tab for more information regarding the procedure.    Assessment/Plan: 1 Day Post-Op   Principal Problem:   S/P TKR (total knee replacement) using cement, right   Advance diet Up with therapy Switch pain meds ro hydromorphone D/C home today if PT goals met   [name] , PA-C 01/16/2022, 8:08 AM

## 2022-01-17 DIAGNOSIS — M1711 Unilateral primary osteoarthritis, right knee: Secondary | ICD-10-CM | POA: Diagnosis not present

## 2022-01-17 NOTE — Progress Notes (Signed)
Physical Therapy Treatment Patient Details Name: Jamie Reid MRN: [id number] DOB: 05-14-1950 Today's Date: 01/17/2022   History of Present Illness 71 yo female s/p R TKA. PMH includes HTN, hiatal hernia, L TKA, Hyperlipidemia, and L TKA.    PT Comments    Pt found supine in bed upon PT entry with c/o 4/10 pain in R knee s/p TKA. Pt required supervision with bed mobility. Sit<>Stand with CGA and RW. Stand pivot transfer to [facility] with CGA and RW. Pt ambulated 140 ft with RW and CGA. Pt negotiated 4 steps with bilateral railing and one step with no railings. Pt able to verbalize and demonstrate understanding of stair education. No LOB or N/V noted during session. Pt would benefit from skilled physical therapy to address the listed deficits (see below) to increase independence with ADLs and function. Current recommendation is HHPT to return pt to PLOF.      Recommendations for follow up therapy are one component of a multi-disciplinary discharge planning process, led by the attending physician.  Recommendations may be updated based on patient status, additional functional criteria and insurance authorization.  Follow Up Recommendations  Home health PT     Assistance Recommended at Discharge Intermittent Supervision/Assistance  Patient can return home with the following A little help with walking and/or transfers;A little help with bathing/dressing/bathroom;Assistance with cooking/housework;Assist for transportation;Help with stairs or ramp for entrance   Equipment Recommendations  None recommended by PT    Recommendations for Other Services       Precautions / Restrictions Precautions Precautions: Knee Precaution Booklet Issued: No Restrictions Weight Bearing Restrictions: Yes RLE Weight Bearing: Weight bearing as tolerated     Mobility  Bed Mobility Overal bed mobility: Needs Assistance Bed Mobility: Supine to Sit     Supine to sit: Supervision           Transfers Overall transfer level: Needs assistance Equipment used: Rolling walker (2 wheels) Transfers: Sit to/from Stand, Bed to chair/wheelchair/BSC Sit to Stand: Min guard   Step pivot transfers: Min guard            Ambulation/Gait   Gait Distance (Feet): 140 Feet Assistive device: Rolling walker (2 wheels) Gait Pattern/deviations: Step-through pattern, Decreased weight shift to right, Antalgic       General Gait Details: no VC required for RW mechanics   Stairs Stairs: Yes Stairs assistance: Min guard Stair Management: Two rails, With walker Number of Stairs: 5 General stair comments: 4 steps with bilateral railing and one stoop with no railings   Wheelchair Mobility    Modified Rankin (Stroke Patients Only)       Balance Overall balance assessment: Needs assistance Sitting-balance support: Feet supported Sitting balance-Leahy Scale: Good     Standing balance support: Bilateral upper extremity supported, Reliant on assistive device for balance Standing balance-Leahy Scale: Fair                              Cognition Arousal/Alertness: Awake/alert Behavior During Therapy: WFL for tasks assessed/performed Overall Cognitive Status: Within Functional Limits for tasks assessed                                 General Comments: pt feeling much better without any N/V episodes        Exercises Total Joint Exercises Goniometric ROM: flexion approx 70 degres. extension appox -10 degrees    General Comments  Pertinent Vitals/Pain Pain Assessment Pain Score: 4  Pain Location: R knee Pain Descriptors / Indicators: Aching Pain Intervention(s): Limited activity within patient's tolerance, Ice applied, Monitored during session    Home Living                          Prior Function            PT Goals (current goals can now be found in the care plan section) Acute Rehab PT Goals Patient Stated Goal: to  return home PT Goal Formulation: With patient Time For Goal Achievement: 01/30/22 Potential to Achieve Goals: Good Progress towards PT goals: Progressing toward goals    Frequency    BID      PT Plan Current plan remains appropriate    Co-evaluation              AM-PAC PT "6 Clicks" Mobility   Outcome Measure  Help needed turning from your back to your side while in a flat bed without using bedrails?: None Help needed moving from lying on your back to sitting on the side of a flat bed without using bedrails?: None Help needed moving to and from a bed to a chair (including a wheelchair)?: A Little Help needed standing up from a chair using your arms (e.g., wheelchair or bedside chair)?: A Little Help needed to walk in hospital room?: A Little Help needed climbing 3-5 steps with a railing? : A Little 6 Click Score: 20    End of Session Equipment Utilized During Treatment: Gait belt Activity Tolerance: Patient tolerated treatment well Patient left: in chair;with SCD's reapplied;with call bell/phone within reach Nurse Communication: Mobility status PT Visit Diagnosis: Unsteadiness on feet (R26.81);Other abnormalities of gait and mobility (R26.89);Muscle weakness (generalized) (M62.81);Difficulty in walking, not elsewhere classified (R26.2);Pain Pain - Right/Left: Right Pain - part of body: Knee     Time: 3361-2244 PT Time Calculation (min) (ACUTE ONLY): 33 min  Charges:                        Claiborne Billings O'Daniel, SPT  01/17/2022, 9:20 AM

## 2022-01-17 NOTE — Plan of Care (Signed)

## 2022-03-27 ENCOUNTER — Ambulatory Visit: Payer: Medicare HMO | Admitting: Dermatology

## 2022-03-27 VITALS — BP 141/70 | HR 67

## 2022-03-27 DIAGNOSIS — L299 Pruritus, unspecified: Secondary | ICD-10-CM

## 2022-03-27 DIAGNOSIS — L720 Epidermal cyst: Secondary | ICD-10-CM | POA: Diagnosis not present

## 2022-03-27 DIAGNOSIS — L570 Actinic keratosis: Secondary | ICD-10-CM | POA: Diagnosis not present

## 2022-03-27 DIAGNOSIS — L304 Erythema intertrigo: Secondary | ICD-10-CM | POA: Diagnosis not present

## 2022-03-27 DIAGNOSIS — L219 Seborrheic dermatitis, unspecified: Secondary | ICD-10-CM

## 2022-03-27 MED ORDER — HYDROCORTISONE 2.5 % EX CREA: TOPICAL_CREAM | CUTANEOUS | 3 refills | Status: AC

## 2022-03-27 MED ORDER — KETOCONAZOLE 2 % EX CREA: TOPICAL_CREAM | CUTANEOUS | 3 refills | Status: AC

## 2022-03-27 NOTE — Progress Notes (Signed)
Follow-Up Visit   Subjective  Jamie Reid is a 72 y.o. female who presents for the following: Check several spots (New spots on left jaw, irritation of the right axilla, itching of the right chest, crusting of the right postauricular. She had knee replacement surgery Jan 15, 2022 and wonders if these could be related. ).   The following portions of the chart were reviewed this encounter and updated as appropriate:       Review of Systems:  No other skin or systemic complaints except as noted in HPI or Assessment and Plan.  Objective  Well appearing patient in no apparent distress; mood and affect are within normal limits.  A focused examination was performed including face, chest, axilla. Relevant physical exam findings are noted in the Assessment and Plan.  L jaw and L cheek x 5 (5) Pink scaly macules.  Right Axilla Indistinct subcutaneous nodule of the right axilla.  Pt states somewhat tender around it  Right Post Ear Crease Pink patches with greasy scale.   Right chest Clear today. Did have red bump with surrounding itching, has cleared    Assessment & Plan  AK (actinic keratosis) (5) L jaw and L cheek x 5  vs ISKs.  May consider 5FU/Calcipotriene Cream to area if not improved on follow-up.  Actinic keratoses are precancerous spots that appear secondary to cumulative UV radiation exposure/sun exposure over time. They are chronic with expected duration over 1 year. A portion of actinic keratoses will progress to squamous cell carcinoma of the skin. It is not possible to reliably predict which spots will progress to skin cancer and so treatment is recommended to prevent development of skin cancer.  Recommend daily broad spectrum sunscreen SPF 30+ to sun-exposed areas, reapply every 2 hours as needed.  Recommend staying in the shade or wearing long sleeves, sun glasses (UVA+UVB protection) and wide brim hats (4-inch brim around the entire circumference of the  hat). Call for new or changing lesions.  Destruction of lesion - L jaw and L cheek x 5  Destruction method: cryotherapy   Informed consent: discussed and consent obtained   Lesion destroyed using liquid nitrogen: Yes   Region frozen until ice ball extended beyond lesion: Yes   Outcome: patient tolerated procedure well with no complications   Post-procedure details: wound care instructions given   Additional details:  Prior to procedure, discussed risks of blister formation, small wound, skin dyspigmentation, or rare scar following cryotherapy. Recommend Vaseline ointment to treated areas while healing.   Epidermal cyst Right Axilla  Possibly inflamed.  Discussed oral antibiotic. Patient defers today due to ulcerative colitis.   May try HC 2.5% Cream to aa for short periods of time prn itch/irritation.  Observation.  RTC if changes/worsening   Seborrheic dermatitis Right Post Ear Crease  Chronic and persistent condition with duration or expected duration over one year. Condition is bothersome/symptomatic for patient. Currently flared.   Seborrheic Dermatitis  -  is a chronic persistent rash characterized by pinkness and scaling most commonly of the mid face but also can occur on the scalp (dandruff), ears; mid chest, mid back and groin.  It tends to be exacerbated by stress and cooler weather.  People who have neurologic disease may experience new onset or exacerbation of existing seborrheic dermatitis.  The condition is not curable but treatable and can be controlled.  Mix hydrocortisone with ketaconazole 2% twice a day. If improved, decrease to hydrocortisone and ketaconazole mixed once a day. If still  clear, decrease to ketaconazole only. Patient has Rxs at home.    Pruritus Right chest  Clear today. Possible bite reaction.  May start hydrocortisone 2.5% cream prn itch. Pt has at home.   Erythema intertrigo  Related Medications hydrocortisone 2.5 % cream Apply to rash  under breasts 1-2 times daily as needed for itch.  ketoconazole (NIZORAL) 2 % cream Apply to rash under breasts 1-2 times daily as needed for rash.   Return as scheduled, for TBSE.  IJamesetta Orleans, CMA, am acting as scribe for Brendolyn Patty, MD .  Documentation: I have reviewed the above documentation for accuracy and completeness, and I agree with the above.  Brendolyn Patty MD

## 2022-03-27 NOTE — Patient Instructions (Addendum)
Cryotherapy Aftercare  Wash gently with soap and water everyday.   Apply Vaseline and Band-Aid daily until healed.    Seborrheic Dermatitis (behind right ear)  Mix hydrocortisone 2.5% with ketaconazole 2% twice a day. If improved, decrease to hydrocortisone and ketaconazole mixed once a day. If still clear, decrease to ketaconazole only.   Due to recent changes in healthcare laws, you may see results of your pathology and/or laboratory studies on MyChart before the doctors have had a chance to review them. We understand that in some cases there may be results that are confusing or concerning to you. Please understand that not all results are received at the same time and often the doctors may need to interpret multiple results in order to provide you with the best plan of care or course of treatment. Therefore, we ask that you please give [place] 2 business days to thoroughly review all your results before contacting the office for clarification. Should we see a critical lab result, you will be contacted sooner.   If You Need Anything After Your Visit  If you have any questions or concerns for your doctor, please call our main line at [phone] and press option 4 to reach your doctor's medical assistant. If no one answers, please leave a voicemail as directed and we will return your call as soon as possible. Messages left after 4 pm will be answered the following business day.   You may also send [place] a message via [place]. We typically respond to MyChart messages within 1-2 business days.  For prescription refills, please ask your pharmacy to contact our office. Our fax number is [phone].  If you have an urgent issue when the clinic is closed that cannot wait until the next business day, you can page your doctor at the number below.    Please note that while we do our best to be available for urgent issues outside of office hours, we are not available 24/7.   If you have an urgent issue  and are unable to reach [place], you may choose to seek medical care at your doctor's office, retail clinic, urgent care center, or emergency room.  If you have a medical emergency, please immediately call 911 or go to the emergency department.  Pager Numbers  - Dr. Nehemiah Massed: [phone]  - Dr. Laurence Ferrari: [phone]  - Dr. Nicole Kindred: [phone]  In the event of inclement weather, please call our main line at [phone] for an update on the status of any delays or closures.  Dermatology Medication Tips: Please keep the boxes that topical medications come in in order to help keep track of the instructions about where and how to use these. Pharmacies typically print the medication instructions only on the boxes and not directly on the medication tubes.   If your medication is too expensive, please contact our office at [phone] option 4 or send [place] a message through [place].   We are unable to tell what your co-pay for medications will be in advance as this is different depending on your insurance coverage. However, we may be able to find a substitute medication at lower cost or fill out paperwork to get insurance to cover a needed medication.   If a prior authorization is required to get your medication covered by your insurance company, please allow [place] 1-2 business days to complete this process.  Drug prices often vary depending on where the prescription is filled and some pharmacies may offer cheaper prices.  The website www.goodrx.com  contains coupons for medications through different pharmacies. The prices here do not account for what the cost may be with help from insurance (it may be cheaper with your insurance), but the website can give you the price if you did not use any insurance.  - You can print the associated coupon and take it with your prescription to the pharmacy.  - You may also stop by our office during regular business hours and pick up a GoodRx coupon card.  - If you need  your prescription sent electronically to a different pharmacy, notify our office through [facility] or by phone at [phone] option 4.     Si Usted Necesita Algo Despus de Su Visita  Tambin puede enviarnos un mensaje a travs de Pharmacist, community. Por lo general respondemos a los mensajes de MyChart en el transcurso de 1 a 2 das hbiles.  Para renovar recetas, por favor pida a su farmacia que se ponga en contacto con nuestra oficina. Harland Dingwall de fax es [place] [phone].  Si tiene un asunto urgente cuando la clnica est cerrada y que no puede esperar hasta el siguiente da hbil, puede llamar/localizar a su doctor(a) al nmero que aparece a continuacin.   Por favor, tenga en cuenta que aunque hacemos todo lo posible para estar disponibles para asuntos urgentes fuera del horario de [place], no estamos disponibles las 24 horas del da, los 7 das de la [place].   Si tiene un problema urgente y no puede comunicarse con nosotros, puede optar por buscar atencin mdica  en el consultorio de su doctor(a), en una clnica privada, en un centro de atencin urgente o en una sala de emergencias.  Si tiene Engineering geologist, por favor llame inmediatamente al 911 o vaya a la sala de emergencias.  Nmeros de bper  - Dr. Nehemiah Massed: [phone]  - Dra. Moye: [phone]  - Dra. Nicole Kindred: [phone]  En caso de inclemencias del [place], por favor llame a Johnsie Kindred principal al [phone] para una actualizacin sobre el [place] de cualquier retraso o cierre.  Consejos para la medicacin en dermatologa: Por favor, guarde las cajas en las que vienen los medicamentos de uso tpico para ayudarle a seguir las instrucciones sobre dnde y cmo usarlos. Las farmacias generalmente imprimen las instrucciones del medicamento slo en las cajas y no directamente en los tubos del [place].   Si su medicamento es muy caro, por favor, pngase en contacto con Zigmund Daniel llamando al  [phone] y presione la opcin 4 o envenos un mensaje a travs de Pharmacist, community.   No podemos decirle cul ser su copago por los medicamentos por adelantado ya que esto es diferente dependiendo de la cobertura de su seguro. Sin embargo, es posible que podamos encontrar un medicamento sustituto a Electrical engineer un formulario para que el seguro cubra el medicamento que se considera necesario.   Si se requiere una autorizacin previa para que su compaa de seguros [place] su medicamento, por favor permtanos de 1 a 2 das hbiles para completar este proceso.  Los precios de los medicamentos varan con frecuencia dependiendo del Environmental consultant de dnde se surte la receta y alguna farmacias pueden ofrecer precios ms baratos.  El sitio web www.goodrx.com tiene cupones para medicamentos de Airline pilot. Los precios aqu no tienen en cuenta lo que podra costar con la ayuda del seguro (puede ser ms barato con su seguro), pero el sitio web puede darle el precio si no utiliz Research scientist (physical sciences).  - Puede imprimir el  cupn correspondiente y llevarlo con su receta a la farmacia.  - Tambin puede pasar por nuestra oficina durante el horario de atencin regular y Charity fundraiser una tarjeta de cupones de GoodRx.  - Si necesita que su receta se enve electrnicamente a una farmacia diferente, informe a nuestra oficina a travs de MyChart de [place] o por telfono llamando al [phone] y presione la opcin 4.

## 2022-06-19 ENCOUNTER — Other Ambulatory Visit: Payer: Self-pay | Admitting: Physician Assistant

## 2022-06-19 DIAGNOSIS — Z1231 Encounter for screening mammogram for malignant neoplasm of breast: Secondary | ICD-10-CM

## 2022-06-25 ENCOUNTER — Ambulatory Visit: Payer: Medicare HMO | Admitting: Dermatology

## 2022-06-25 VITALS — BP 135/83 | HR 60

## 2022-06-25 DIAGNOSIS — L820 Inflamed seborrheic keratosis: Secondary | ICD-10-CM | POA: Diagnosis not present

## 2022-06-25 DIAGNOSIS — Z5111 Encounter for antineoplastic chemotherapy: Secondary | ICD-10-CM

## 2022-06-25 DIAGNOSIS — Z1283 Encounter for screening for malignant neoplasm of skin: Secondary | ICD-10-CM | POA: Diagnosis not present

## 2022-06-25 DIAGNOSIS — Z7189 Other specified counseling: Secondary | ICD-10-CM

## 2022-06-25 DIAGNOSIS — L570 Actinic keratosis: Secondary | ICD-10-CM

## 2022-06-25 DIAGNOSIS — D2271 Melanocytic nevi of right lower limb, including hip: Secondary | ICD-10-CM

## 2022-06-25 DIAGNOSIS — D229 Melanocytic nevi, unspecified: Secondary | ICD-10-CM

## 2022-06-25 DIAGNOSIS — L719 Rosacea, unspecified: Secondary | ICD-10-CM

## 2022-06-25 DIAGNOSIS — L578 Other skin changes due to chronic exposure to nonionizing radiation: Secondary | ICD-10-CM | POA: Diagnosis not present

## 2022-06-25 DIAGNOSIS — L814 Other melanin hyperpigmentation: Secondary | ICD-10-CM

## 2022-06-25 DIAGNOSIS — L219 Seborrheic dermatitis, unspecified: Secondary | ICD-10-CM

## 2022-06-25 DIAGNOSIS — L821 Other seborrheic keratosis: Secondary | ICD-10-CM

## 2022-06-25 DIAGNOSIS — I872 Venous insufficiency (chronic) (peripheral): Secondary | ICD-10-CM

## 2022-06-25 DIAGNOSIS — Z85828 Personal history of other malignant neoplasm of skin: Secondary | ICD-10-CM

## 2022-06-25 MED ORDER — METRONIDAZOLE 0.75 % EX CREA: TOPICAL_CREAM | CUTANEOUS | 3 refills | Status: AC

## 2022-06-25 NOTE — Patient Instructions (Addendum)
Cryotherapy Aftercare  Wash gently with soap and water everyday.   Apply Vaseline and Band-Aid daily until healed.    Instructions for [facility] Medications  One or more of your medications was sent to the [facility] mail order compounding pharmacy. You will receive an email from them and can purchase the medicine through that link. It will then be mailed to your home at the address you confirmed. If for any reason you do not receive an email from them, please check your spam folder. If you still do not find the email, please let us know. [facility] phone number is [phone].    Stasis in the legs causes chronic leg swelling, which may result in itchy or painful rashes, skin discoloration, skin texture changes, and sometimes ulceration.  Recommend daily graduated compression hose/stockings- easiest to put on first thing in morning, remove at bedtime.  Elevate legs as much as possible. Avoid salt/sodium rich foods.   Start metronidazole 0.75% cream - apply to nose and cheeks once to twice daily for rosacea.    Rosacea  What is rosacea? Rosacea (say: ro-zay-sha) is a common skin disease that usually begins as a trend of flushing or blushing easily.  As rosacea progresses, a persistent redness in the center of the face will develop and may gradually spread beyond the nose and cheeks to the forehead and chin.  In some cases, the ears, chest, and back could be affected.  Rosacea may appear as tiny blood vessels or small red bumps that occur in crops.  Frequently they can contain pus, and are called "pustules".  If the bumps do not contain pus, they are referred to as "papules".  Rarely, in prolonged, untreated cases of rosacea, the oil glands of the nose and cheeks may become permanently enlarged.  This is called rhinophyma, and is seen more frequently in men.  Signs and Risks In its beginning stages, rosacea tends to come and go, which makes it difficult to recognize.  It can  start as intermittent flushing of the face.  Eventually, blood vessels may become permanently visible.  Pustules and papules can appear, but can be mistaken for adult acne.  People of all races, ages, genders and ethnic groups are at risk of developing rosacea.  However, it is more common in women (especially around menopause) and adults with fair skin between the ages of 22 and 49.  Treatment Dermatologists typically recommend a combination of treatments to effectively manage rosacea.  Treatment can improve symptoms and may stop the progression of the rosacea.  Treatment may involve both topical and oral medications.  The tetracycline antibiotics are often used for their anti-inflammatory effect; however, because of the possibility of developing antibiotic resistance, they should not be used long term at full dose.  For dilated blood vessels the options include electrodessication (uses electric current through a small needle), laser treatment, and cosmetics to hide the redness.   With all forms of treatment, improvement is a slow process, and patients may not see any results for the first 3-4 weeks.  It is very important to avoid the sun and other triggers.  Patients must wear sunscreen daily.  Skin Care Instructions: Cleanse the skin with a mild soap such as CeraVe cleanser, Cetaphil cleanser, or Dove soap once or twice daily as needed. Moisturize with Eucerin Redness Relief Daily Perfecting Lotion (has a subtle green tint), CeraVe Moisturizing Cream, or Oil of Olay Daily Moisturizer with sunscreen every morning and/or night as recommended. Makeup should  be "non-comedogenic" (won't clog pores) and be labeled "for sensitive skin". Good choices for cosmetics are: Neutrogena, Almay, and Physician's Formula.  Any product with a green tint tends to offset a red complexion. If your eyes are dry and irritated, use artificial tears 2-3 times per day and cleanse the eyelids daily with baby shampoo.  Have your  eyes examined at least every 2 years.  Be sure to tell your eye doctor that you have rosacea. Alcoholic beverages tend to cause flushing of the skin, and may make rosacea worse. Always wear sunscreen, protect your skin from extreme hot and cold temperatures, and avoid spicy foods, hot drinks, and mechanical irritation such as rubbing, scrubbing, or massaging the face.  Avoid harsh skin cleansers, cleansing masks, astringents, and exfoliation. If a particular product burns or makes your face feel tight, then it is likely to flare your rosacea. If you are having difficulty finding a sunscreen that you can tolerate, you may try switching to a chemical-free sunscreen.  These are ones whose active ingredient is zinc oxide or titanium dioxide only.  They should also be fragrance free, non-comedogenic, and labeled for sensitive skin. Rosacea triggers may vary from person to person.  There are a variety of foods that have been reported to trigger rosacea.  Some patients find that keeping a diary of what they were doing when they flared helps them avoid triggers.   Due to recent changes in healthcare laws, you may see results of your pathology and/or laboratory studies on MyChart before the doctors have had a chance to review them. We understand that in some cases there may be results that are confusing or concerning to you. Please understand that not all results are received at the same time and often the doctors may need to interpret multiple results in order to provide you with the best plan of care or course of treatment. Therefore, we ask that you please give [place] 2 business days to thoroughly review all your results before contacting the office for clarification. Should we see a critical lab result, you will be contacted sooner.   If You Need Anything After Your Visit  If you have any questions or concerns for your doctor, please call our main line at [phone] and press option 4 to reach your doctor's  medical assistant. If no one answers, please leave a voicemail as directed and we will return your call as soon as possible. Messages left after 4 pm will be answered the following business day.   You may also send [place] a message via MyChart. We typically respond to MyChart messages within 1-2 business days.  For prescription refills, please ask your pharmacy to contact our office. Our fax number is [phone].  If you have an urgent issue when the clinic is closed that cannot wait until the next business day, you can page your doctor at the number below.    Please note that while we do our best to be available for urgent issues outside of office hours, we are not available 24/7.   If you have an urgent issue and are unable to reach [place], you may choose to seek medical care at your doctor's office, retail clinic, urgent care center, or emergency room.  If you have a medical emergency, please immediately call 911 or go to the emergency department.  Pager Numbers  - Dr. Gwen Pounds: [phone]  - Dr. Neale Burly: [phone]  - Dr. Roseanne Reno: [phone]  In the event of inclement weather, please call  our main line at [phone] for an update on the status of any delays or closures.  Dermatology Medication Tips: Please keep the boxes that topical medications come in in order to help keep track of the instructions about where and how to use these. Pharmacies typically print the medication instructions only on the boxes and not directly on the medication tubes.   If your medication is too expensive, please contact our office at [phone] option 4 or send [place] a message through MyChart.   We are unable to tell what your co-pay for medications will be in advance as this is different depending on your insurance coverage. However, we may be able to find a substitute medication at lower cost or fill out paperwork to get insurance to cover a needed medication.   If a prior authorization is required to  get your medication covered by your insurance company, please allow [place] 1-2 business days to complete this process.  Drug prices often vary depending on where the prescription is filled and some pharmacies may offer cheaper prices.  The website www.goodrx.com contains coupons for medications through different pharmacies. The prices here do not account for what the cost may be with help from insurance (it may be cheaper with your insurance), but the website can give you the price if you did not use any insurance.  - You can print the associated coupon and take it with your prescription to the pharmacy.  - You may also stop by our office during regular business hours and pick up a GoodRx coupon card.  - If you need your prescription sent electronically to a different pharmacy, notify our office through [facility] or by phone at [phone] option 4.     Si Usted Necesita Algo Despus de Su Visita  Tambin puede enviarnos un mensaje a travs de Clinical cytogeneticist. Por lo general respondemos a los mensajes de MyChart en el transcurso de 1 a 2 das hbiles.  Para renovar recetas, por favor pida a su farmacia que se ponga en contacto con nuestra oficina. Annie Sable de fax es [place] [phone].  Si tiene un asunto urgente cuando la clnica est cerrada y que no puede esperar hasta el siguiente da hbil, puede llamar/localizar a su doctor(a) al nmero que aparece a continuacin.   Por favor, tenga en cuenta que aunque hacemos todo lo posible para estar disponibles para asuntos urgentes fuera del horario de [place], no estamos disponibles las 24 horas del da, los 7 [address]  [address] de la [place].   Si tiene un problema urgente y no puede comunicarse con nosotros, puede optar por buscar atencin mdica  en el consultorio de su doctor(a), en una clnica privada, en un centro de atencin urgente o en una sala de emergencias.  Si tiene Engineer, drilling, por favor llame inmediatamente al 911 o vaya a la sala de  emergencias.  Nmeros de bper  - Dr. Gwen Pounds: [phone]  - Dra. Moye: [phone]  - Dra. Roseanne Reno: [phone]  En caso de inclemencias del [place], por favor llame a Lacy Duverney principal al [phone] para una actualizacin sobre el [place] de cualquier retraso o cierre.  Consejos para la medicacin en dermatologa: Por favor, guarde las cajas en las que vienen los medicamentos de uso tpico para ayudarle a seguir las instrucciones sobre dnde y cmo usarlos. Las farmacias generalmente imprimen las instrucciones del medicamento slo en las cajas y no directamente en los tubos del [place].   Si su medicamento es [employer], por favor,  pngase en contacto con nuestra oficina llamando al [phone] y presione la opcin 4 o envenos un mensaje a travs de Clinical cytogeneticist.   No podemos decirle cul ser su copago por los medicamentos por adelantado ya que esto es diferente dependiendo de la cobertura de su seguro. Sin embargo, es posible que podamos encontrar un medicamento sustituto a Audiological scientist un formulario para que el seguro cubra el medicamento que se considera necesario.   Si se requiere una autorizacin previa para que su compaa de seguros [place] su medicamento, por favor permtanos de 1 a 2 das hbiles para completar [address]  Los precios de los medicamentos varan con frecuencia dependiendo del Environmental consultant de dnde se surte la receta y alguna farmacias pueden ofrecer precios ms baratos.  El sitio web www.goodrx.com tiene cupones para medicamentos de Health and safety inspector. Los precios aqu no tienen en cuenta lo que podra costar con la ayuda del seguro (puede ser ms barato con su seguro), pero el sitio web puede darle el precio si no utiliz Tourist information centre manager.  - Puede imprimir el cupn correspondiente y llevarlo con su receta a la farmacia.  - Tambin puede pasar por nuestra oficina durante el horario de atencin regular y Education officer, museum una tarjeta de cupones de GoodRx.  - Si  necesita que su receta se enve electrnicamente a una farmacia diferente, informe a nuestra oficina a travs de MyChart de [place] o por telfono llamando al [phone] y presione la opcin 4.

## 2022-06-25 NOTE — Progress Notes (Signed)
Follow-Up Visit   Subjective  Jamie Reid is a 72 y.o. female who presents for the following: Skin Cancer Screening and Full Body Skin Exam.  The patient presents for Total-Body Skin Exam (TBSE) for skin cancer screening and mole check. The patient has spots, moles and lesions to be evaluated, some may be new or changing and the patient has concerns that these could be cancer. She has a bump on her left nose that she noticed last week. 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 Recheck right nose, left jaw, left cheek, history of AKs.    The following portions of the chart were reviewed this encounter and updated as appropriate: medications, allergies, medical history  Review of Systems:  No other skin or systemic complaints except as noted in HPI or Assessment and Plan.  Objective  Well appearing patient in no apparent distress; mood and affect are within normal limits.  A full examination was performed including scalp, head, eyes, ears, nose, lips, neck, chest, axillae, abdomen, back, buttocks, bilateral upper extremities, bilateral lower extremities, hands, feet, fingers, toes, fingernails, and toenails. All findings within normal limits unless otherwise noted below.   Relevant physical exam findings are noted in the Assessment and Plan.  R upper arm x 1, R chest x 1, L med shoulder x 1, L lower chest x 1 (4) Erythematous stuck-on, waxy papule  R earlobe x 1, L cheek x 5 (6) Pink/brown scaly macules    Assessment & Plan   LENTIGINES, SEBORRHEIC KERATOSES, HEMANGIOMAS - Benign normal skin lesions - Benign-appearing - Call for any changes  MELANOCYTIC NEVI - Tan-brown and/or pink-flesh-colored symmetric macules and papules - R calf 2.85mm brown macule darker edge, stable - Benign appearing on exam today - Observation - Call clinic for new or changing moles - Recommend daily use of broad spectrum spf 30+ sunscreen to sun-exposed areas.   ACTINIC DAMAGE WITH  PRECANCEROUS ACTINIC KERATOSES Counseling for Topical Chemotherapy Management: Patient exhibits: - Severe, confluent actinic changes with pre-cancerous actinic keratoses that is secondary to cumulative UV radiation exposure over time - Condition that is severe; chronic, not at goal. - diffuse scaly erythematous macules and papules with underlying dyspigmentation - Discussed Prescription "Field Treatment" topical Chemotherapy for Severe, Chronic Confluent Actinic Changes with Pre-Cancerous Actinic Keratoses Field treatment involves treatment of an entire area of skin that has confluent Actinic Changes (Sun/ Ultraviolet light damage) and PreCancerous Actinic Keratoses by method of PhotoDynamic Therapy (PDT) and/or prescription Topical Chemotherapy agents such as 5-fluorouracil, 5-fluorouracil/calcipotriene, and/or imiquimod.  The purpose is to decrease the number of clinically evident and subclinical PreCancerous lesions to prevent progression to development of skin cancer by chemically destroying early precancer changes that may or may not be visible.  It has been shown to reduce the risk of developing skin cancer in the treated area. As a result of treatment, redness, scaling, crusting, and open sores may occur during treatment course. One or more than one of these methods may be used and may have to be used several times to control, suppress and eliminate the PreCancerous changes. Discussed treatment course, expected reaction, and possible side effects. - Recommend daily broad spectrum sunscreen SPF 30+ to sun-exposed areas, reapply every 2 hours as needed.  - Staying in the shade or wearing long sleeves, sun glasses (UVA+UVB protection) and wide brim hats (4-inch brim around the entire circumference of the hat) are also recommended. - Call for new or changing lesions. Once areas healed from cryotherapy, start  5FU/Calcipotriene Cream.  - Start 5-fluorouracil/calcipotriene cream twice a day for 5-7 days  to affected areas including left cheek. Prescription sent to [facility]. Patient advised they will receive an email to purchase the medication online and have it sent to their home. Patient provided with handout reviewing treatment course and side effects and advised to call or message [place] on MyChart with any concerns.  Reviewed course of treatment and expected reaction.  Patient advised to expect inflammation and crusting and advised that erosions are possible.  Patient advised to be diligent with sun protection during and after treatment. Counseled to keep medication out of reach of children and pets.   SKIN CANCER SCREENING PERFORMED TODAY.  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 Condition is bothersome/symptomatic for patient. Currently flared.  Rosacea is a chronic progressive skin condition usually affecting the face of adults, causing redness and/or acne bumps. It is treatable but not curable. It sometimes affects the eyes (ocular rosacea) as well. It may respond to topical and/or systemic medication and can flare with stress, sun exposure, alcohol, exercise, topical steroids (including hydrocortisone/cortisone 10) and some foods.  Daily application of broad spectrum spf 30+ sunscreen to face is recommended to reduce flares.  Treatment Plan Start metronidazole 0.75% cream Apply to cheeks and nose QD/BID dsp 45g 3Rf.  Inflamed seborrheic keratosis (4) R upper arm x 1, R chest x 1, L med shoulder x 1, L lower chest x 1  Symptomatic, irritating, patient would like treated.  Destruction of lesion - R upper arm x 1, R chest x 1, L med shoulder x 1, L lower chest x 1  Destruction method: cryotherapy   Informed consent: discussed and consent obtained   Lesion destroyed using liquid nitrogen: Yes   Region frozen  until ice ball extended beyond lesion: Yes   Outcome: patient tolerated procedure well with no complications   Post-procedure details: wound care instructions given   Additional details:  Prior to procedure, discussed risks of blister formation, small wound, skin dyspigmentation, or rare scar following cryotherapy. Recommend Vaseline ointment to treated areas while healing.   AK (actinic keratosis) (6) R earlobe x 1, L cheek x 5  vs ISKs  Actinic keratoses are precancerous spots that appear secondary to cumulative UV radiation exposure/sun exposure over time. They are chronic with expected duration over 1 year. A portion of actinic keratoses will progress to squamous cell carcinoma of the skin. It is not possible to reliably predict which spots will progress to skin cancer and so treatment is recommended to prevent development of skin cancer.  Recommend daily broad spectrum sunscreen SPF 30+ to sun-exposed areas, reapply every 2 hours as needed.  Recommend staying in the shade or wearing long sleeves, sun glasses (UVA+UVB protection) and wide brim hats (4-inch brim around the entire circumference of the hat). Call for new or changing lesions.  Destruction of lesion - R earlobe x 1, L cheek x 5  Destruction method: cryotherapy   Informed consent: discussed and consent obtained   Lesion destroyed using liquid nitrogen: Yes   Region frozen until ice ball extended beyond lesion: Yes   Outcome: patient tolerated procedure well with no complications   Post-procedure details: wound care instructions given   Additional details:  Prior to procedure, discussed risks of blister formation, small wound, skin dyspigmentation, or rare scar  following cryotherapy. Recommend Vaseline ointment to treated areas while healing.   SEBORRHEIC DERMATITIS Exam: Mild erythema and scale of the right posterior ear.  Chronic and persistent condition with duration or expected duration over one year. Condition is  symptomatic/ bothersome to patient. Not currently at goal, but improving.   Seborrheic Dermatitis is a chronic persistent rash characterized by pinkness and scaling most commonly of the mid face but also can occur on the scalp (dandruff), ears; mid chest, mid back and groin.  It tends to be exacerbated by stress and cooler weather.  People who have neurologic disease may experience new onset or exacerbation of existing seborrheic dermatitis.  The condition is not curable but treatable and can be controlled.  Treatment Plan: Continue Ketoconazole 2% Cream QD/BID prn.   STASIS DERMATITIS Exam: Erythematous, scaly patches involving the ankle and distal lower leg with associated lower leg pitting edema.  Chronic and persistent condition with duration or expected duration over one year. Condition is symptomatic/ bothersome to patient. Not currently at goal.  Stasis in the legs causes chronic leg swelling, which may result in itchy or painful rashes, skin discoloration, skin texture changes, and sometimes ulceration.  Recommend daily graduated compression hose/stockings- easiest to put on first thing in morning, remove at bedtime.  Elevate legs as much as possible. Avoid salt/sodium rich foods.  Treatment Plan: Continue CeraVe Cream daily. Recommend compression sock daily.   HISTORY OF BASAL CELL CARCINOMA OF THE SKIN - No evidence of recurrence today of the right lateral mid back - Recommend regular full body skin exams - Recommend daily broad spectrum sunscreen SPF 30+ to sun-exposed areas, reapply every 2 hours as needed.  - Call if any new or changing lesions are noted between office visits   Return in about 6 months (around 12/25/2022) for Hx AKs.  ICherlyn Labella, CMA, am acting as scribe for Willeen Niece, MD .   Documentation: I have reviewed the above documentation for accuracy and completeness, and I agree with the above.  Willeen Niece, MD

## 2022-08-30 ENCOUNTER — Ambulatory Visit: Admission: RE | Admit: 2022-08-30 | Payer: Medicare HMO | Source: Ambulatory Visit

## 2022-08-30 DIAGNOSIS — Z1231 Encounter for screening mammogram for malignant neoplasm of breast: Secondary | ICD-10-CM | POA: Diagnosis present

## 2022-12-30 ENCOUNTER — Ambulatory Visit: Payer: Medicare HMO | Admitting: Dermatology

## 2022-12-30 DIAGNOSIS — Z872 Personal history of diseases of the skin and subcutaneous tissue: Secondary | ICD-10-CM

## 2022-12-30 DIAGNOSIS — L578 Other skin changes due to chronic exposure to nonionizing radiation: Secondary | ICD-10-CM | POA: Diagnosis not present

## 2022-12-30 DIAGNOSIS — L814 Other melanin hyperpigmentation: Secondary | ICD-10-CM

## 2022-12-30 DIAGNOSIS — L219 Seborrheic dermatitis, unspecified: Secondary | ICD-10-CM

## 2022-12-30 DIAGNOSIS — W908XXA Exposure to other nonionizing radiation, initial encounter: Secondary | ICD-10-CM

## 2022-12-30 NOTE — Progress Notes (Signed)
   Follow-Up Visit   Subjective  Jamie Reid is a 72 y.o. female who presents for the following: AK 37m f/u, L cheek, R earlobe 5FU/Calcipotriene to L cheek not much of reaction, check spot L cheek- noticed last week but getting better now The patient has spots, moles and lesions to be evaluated, some may be new or changing and the patient may have concern these could be cancer.   The following portions of the chart were reviewed this encounter and updated as appropriate: medications, allergies, medical history  Review of Systems:  No other skin or systemic complaints except as noted in HPI or Assessment and Plan.  Objective  Well appearing patient in no apparent distress; mood and affect are within normal limits.   A focused examination was performed of the following areas: Face, ears, L cheek is clear today  Relevant exam findings are noted in the Assessment and Plan.    Assessment & Plan   HISTORY OF PRECANCEROUS ACTINIC KERATOSIS - site(s) of PreCancerous Actinic Keratosis clear today.- L cheek improved from LN2 and 5FU/Caclipotriene treatment - these may recur and new lesions may form requiring treatment to prevent transformation into skin cancer - observe for new or changing spots and contact [facility] for appointment if occur - photoprotection with sun protective clothing; sunglasses and broad spectrum sunscreen with SPF of at least 30 + and frequent self skin exams recommended - yearly exams by a dermatologist recommended for persons with history of PreCancerous Actinic Keratoses    ACTINIC DAMAGE - chronic, secondary to cumulative UV radiation exposure/sun exposure over time - diffuse scaly erythematous macules with underlying dyspigmentation - Recommend daily broad spectrum sunscreen SPF 30+ to sun-exposed areas, reapply every 2 hours as needed.  - Recommend staying in the shade or wearing long sleeves, sun glasses (UVA+UVB protection) and wide brim hats  (4-inch brim around the entire circumference of the hat). - Call for new or changing lesions.   LENTIGINES Exam: scattered tan macules face  Due to sun exposure Treatment Plan: Benign-appearing, observe. Recommend daily broad spectrum sunscreen SPF 30+ to sun-exposed areas, reapply every 2 hours as needed.  Call for any changes   SEBORRHEIC DERMATITIS R post ear Exam: R post ear clear today  Chronic condition with duration or expected duration over one year. Currently well-controlled.   Seborrheic Dermatitis is a chronic persistent rash characterized by pinkness and scaling most commonly of the mid face but also can occur on the scalp (dandruff), ears; mid chest, mid back and groin.  It tends to be exacerbated by stress and cooler weather.  People who have neurologic disease may experience new onset or exacerbation of existing seborrheic dermatitis.  The condition is not curable but treatable and can be controlled.  Treatment Plan: Cont Ketoconazole 2% cr qd/bid prn flares   Return in about 6 months (around 06/29/2023) for TBSE, Hx of BCC, Hx of AKs.  I, Ardis Rowan, RMA, am acting as scribe for Willeen Niece, MD .   Documentation: I have reviewed the above documentation for accuracy and completeness, and I agree with the above.  Willeen Niece, MD

## 2022-12-30 NOTE — Patient Instructions (Signed)

## 2023-01-17 ENCOUNTER — Other Ambulatory Visit: Payer: Self-pay | Admitting: Nurse Practitioner

## 2023-01-17 DIAGNOSIS — K5130 Ulcerative (chronic) rectosigmoiditis without complications: Secondary | ICD-10-CM

## 2023-01-17 DIAGNOSIS — R748 Abnormal levels of other serum enzymes: Secondary | ICD-10-CM

## 2023-01-22 ENCOUNTER — Ambulatory Visit
Admission: RE | Admit: 2023-01-22 | Discharge: 2023-01-22 | Disposition: A | Discharge status: Home / Self Care | Payer: Medicare HMO | Source: Ambulatory Visit | Attending: Nurse Practitioner | Admitting: Nurse Practitioner

## 2023-01-22 DIAGNOSIS — R748 Abnormal levels of other serum enzymes: Secondary | ICD-10-CM | POA: Diagnosis present

## 2023-01-22 DIAGNOSIS — K5130 Ulcerative (chronic) rectosigmoiditis without complications: Secondary | ICD-10-CM | POA: Diagnosis present

## 2023-06-19 ENCOUNTER — Telehealth: Payer: Self-pay

## 2023-06-19 NOTE — Telephone Encounter (Signed)
 The patient called to request a medication refill to be sent to her pharmacy. She mentioned that she had contacted the pharmacy, and they informed her that they had sent over three refill requests but advised her to call the provider's office directly.  The patient then contacted our practice. After reviewing her information, I informed her that she is a patient of [facility] and would need to contact their office for further assistance. I offered to transfer her directly to [facility], and she agreed.  The patient mentioned that when she searched online, our number appeared as the contact. I reassured her that it was not a problem and that I was happy to assist by transferring her call. She was understanding and appreciative.

## 2023-06-20 ENCOUNTER — Encounter: Payer: Self-pay | Admitting: *Deleted

## 2023-06-30 ENCOUNTER — Encounter: Payer: Self-pay | Admitting: *Deleted

## 2023-06-30 ENCOUNTER — Encounter
Admission: RE | Disposition: A | Discharge status: Home / Self Care | Payer: Self-pay | Source: Home / Self Care | Attending: Gastroenterology

## 2023-06-30 ENCOUNTER — Other Ambulatory Visit: Payer: Self-pay

## 2023-06-30 ENCOUNTER — Ambulatory Visit: Admitting: Anesthesiology

## 2023-06-30 ENCOUNTER — Ambulatory Visit
Admission: RE | Admit: 2023-06-30 | Discharge: 2023-06-30 | Disposition: A | Discharge status: Home / Self Care | Payer: Medicare HMO | Attending: Gastroenterology | Admitting: Gastroenterology

## 2023-06-30 ENCOUNTER — Other Ambulatory Visit: Payer: Self-pay | Admitting: Physician Assistant

## 2023-06-30 DIAGNOSIS — Z6834 Body mass index (BMI) 34.0-34.9, adult: Secondary | ICD-10-CM | POA: Insufficient documentation

## 2023-06-30 DIAGNOSIS — K5150 Left sided colitis without complications: Secondary | ICD-10-CM | POA: Diagnosis not present

## 2023-06-30 DIAGNOSIS — I10 Essential (primary) hypertension: Secondary | ICD-10-CM | POA: Insufficient documentation

## 2023-06-30 DIAGNOSIS — M1990 Unspecified osteoarthritis, unspecified site: Secondary | ICD-10-CM | POA: Diagnosis not present

## 2023-06-30 DIAGNOSIS — K640 First degree hemorrhoids: Secondary | ICD-10-CM | POA: Insufficient documentation

## 2023-06-30 DIAGNOSIS — E669 Obesity, unspecified: Secondary | ICD-10-CM | POA: Diagnosis not present

## 2023-06-30 DIAGNOSIS — Z9049 Acquired absence of other specified parts of digestive tract: Secondary | ICD-10-CM | POA: Diagnosis not present

## 2023-06-30 DIAGNOSIS — K449 Diaphragmatic hernia without obstruction or gangrene: Secondary | ICD-10-CM | POA: Diagnosis not present

## 2023-06-30 DIAGNOSIS — K219 Gastro-esophageal reflux disease without esophagitis: Secondary | ICD-10-CM | POA: Insufficient documentation

## 2023-06-30 DIAGNOSIS — Z1211 Encounter for screening for malignant neoplasm of colon: Secondary | ICD-10-CM | POA: Diagnosis present

## 2023-06-30 DIAGNOSIS — K5140 Inflammatory polyps of colon without complications: Secondary | ICD-10-CM | POA: Insufficient documentation

## 2023-06-30 DIAGNOSIS — Z800 Family history of malignant neoplasm of digestive organs: Secondary | ICD-10-CM | POA: Insufficient documentation

## 2023-06-30 DIAGNOSIS — K5730 Diverticulosis of large intestine without perforation or abscess without bleeding: Secondary | ICD-10-CM | POA: Diagnosis not present

## 2023-06-30 DIAGNOSIS — Z1231 Encounter for screening mammogram for malignant neoplasm of breast: Secondary | ICD-10-CM

## 2023-06-30 HISTORY — DX: Heartburn: R12

## 2023-06-30 HISTORY — DX: Gastritis, unspecified, without bleeding: K29.70

## 2023-06-30 HISTORY — DX: Benign neoplasm of unspecified adrenal gland: D35.00

## 2023-06-30 HISTORY — PX: COLONOSCOPY WITH PROPOFOL: SHX5780

## 2023-06-30 HISTORY — DX: Headache, unspecified: R51.9

## 2023-06-30 SURGERY — COLONOSCOPY WITH PROPOFOL
Anesthesia: General

## 2023-06-30 MED ORDER — PROPOFOL 10 MG/ML IV BOLUS
INTRAVENOUS | Status: AC
  Filled 2023-06-30: qty 40

## 2023-06-30 MED ORDER — SODIUM CHLORIDE 0.9 % IV SOLN: INTRAVENOUS | Status: DC

## 2023-06-30 MED ORDER — PROPOFOL 10 MG/ML IV BOLUS
INTRAVENOUS | Status: DC | PRN
  Administered 2023-06-30: 20 mg via INTRAVENOUS
  Administered 2023-06-30: 100 ug/kg/min via INTRAVENOUS
  Administered 2023-06-30: 50 mg via INTRAVENOUS

## 2023-06-30 NOTE — Transfer of Care (Signed)
 Immediate Anesthesia Transfer of Care Note  Patient: Jamie Reid  Procedure(s) Performed: COLONOSCOPY WITH PROPOFOL   Patient Location: PACU  Anesthesia Type:MAC  Level of Consciousness: awake  Airway & Oxygen Therapy: Patient Spontanous Breathing  Post-op Assessment: Report given to RN and Post -op Vital signs reviewed and stable  Post vital signs: Reviewed and stable  Last Vitals:  Vitals Value Taken Time  BP 90/46 06/30/23 0814  Temp 35.9 C 06/30/23 0813  Pulse 58 06/30/23 0815  Resp 21 06/30/23 0815  SpO2 97 % 06/30/23 0815  Vitals shown include unfiled device data.  Last Pain:  Vitals:   06/30/23 0813  TempSrc: Tympanic  PainSc: Asleep         Complications: No notable events documented.

## 2023-06-30 NOTE — Op Note (Signed)
 [facility] Patient Name: Jamie Reid Procedure Date: 06/30/2023 7:47 AM MRN: [id number] Account #: [id number] Date of Birth: 18-Feb-1951 Admit Type: Outpatient Age: 73 Room: [facility] ENDO ROOM 3 Gender: Female Note Status: Supervisor Override Instrument Name: Charlyn Cooley [id number] Procedure:             Colonoscopy Indications:           Screening in patient at increased risk: Family history                         of 1st-degree relative with colorectal cancer,                         Left-sided chronic ulcerative colitis Providers:             Leida Puna MD, MD Referring MD:          Cleola Dach, MD (Referring MD) Medicines:             Monitored Anesthesia Care Complications:         No immediate complications. Estimated blood loss:                         Minimal. Procedure:             Pre-Anesthesia Assessment:                        - Prior to the procedure, a History and Physical was                         performed, and patient medications and allergies were                         reviewed. The patient is competent. The risks and                         benefits of the procedure and the sedation options and                         risks were discussed with the patient. All questions                         were answered and informed consent was obtained.                         Patient identification and proposed procedure were                         verified by the physician, the nurse, the                         anesthesiologist, the anesthetist and the technician                         in the endoscopy suite. Mental Status Examination:                         alert and oriented. Airway Examination: normal  oropharyngeal airway and neck mobility. Respiratory                         Examination: clear to auscultation. CV Examination:                         normal. Prophylactic Antibiotics: The  patient does not                         require prophylactic antibiotics. Prior                         Anticoagulants: The patient has taken no anticoagulant                         or antiplatelet agents. ASA Grade Assessment: II - A                         patient with mild systemic disease. After reviewing                         the risks and benefits, the patient was deemed in                         satisfactory condition to undergo the procedure. The                         anesthesia plan was to use monitored anesthesia care                         (MAC). Immediately prior to administration of                         medications, the patient was re-assessed for adequacy                         to receive sedatives. The heart rate, respiratory                         rate, oxygen saturations, blood pressure, adequacy of                         pulmonary ventilation, and response to care were                         monitored throughout the procedure. The physical                         status of the patient was re-assessed after the                         procedure.                        After obtaining informed consent, the colonoscope was                         passed under direct vision. Throughout the procedure,  the patient's blood pressure, pulse, and oxygen                         saturations were monitored continuously. The                         Colonoscope was introduced through the anus and                         advanced to the the terminal ileum, with                         identification of the appendiceal orifice and IC                         valve. The colonoscopy was performed without                         difficulty. The patient tolerated the procedure well.                         The quality of the bowel preparation was good. The                         terminal ileum, ileocecal valve, appendiceal orifice,                          and rectum were photographed. Findings:      The perianal and digital rectal examinations were normal.      The terminal ileum appeared normal.      A few large-mouthed and small-mouthed diverticula were found in the       hepatic flexure and ascending colon.      A 3 mm polyp was found in the transverse colon. The polyp was sessile.       The polyp was removed with a cold snare. Resection and retrieval were       complete. Estimated blood loss was minimal.      Internal hemorrhoids were found during retroflexion. The hemorrhoids       were Grade I (internal hemorrhoids that do not prolapse).      The exam was otherwise without abnormality on direct and retroflexion       views. Impression:            - The examined portion of the ileum was normal.                        - Diverticulosis at the hepatic flexure and in the                         ascending colon.                        - One 3 mm polyp in the transverse colon, removed with                         a cold snare. Resected and retrieved.                        - Internal hemorrhoids.                        -  The examination was otherwise normal on direct and                         retroflexion views. Recommendation:        - Discharge patient to home.                        - Resume previous diet.                        - Continue present medications.                        - Await pathology results.                        - Repeat colonoscopy in 3 years for surveillance.                        - Return to referring physician as previously                         scheduled. Procedure Code(s):     --- Professional ---                        [phone], Colonoscopy, flexible; with removal of                         tumor(s), polyp(s), or other lesion(s) by snare                         technique Diagnosis Code(s):     --- Professional ---                        K64.0, First degree hemorrhoids                        D12.3,  Benign neoplasm of transverse colon (hepatic                         flexure or splenic flexure)                        K51.50, Left sided colitis without complications                        K57.30, Diverticulosis of large intestine without                         perforation or abscess without bleeding CPT copyright 2022 American Medical Association. All rights reserved. The codes documented in this report are preliminary and upon coder review may  be revised to meet current compliance requirements. Leida Puna MD, MD 06/30/2023 8:15:53 AM Number of Addenda: 0 Note Initiated On: 06/30/2023 7:47 AM Scope Withdrawal Time: 0 hours 6 minutes 21 seconds  Total Procedure Duration: 0 hours 13 minutes 48 seconds  Estimated Blood Loss:  Estimated blood loss was minimal.      [facility]

## 2023-06-30 NOTE — Anesthesia Postprocedure Evaluation (Signed)
 Anesthesia Post Note  Patient: Jamie Reid  Procedure(s) Performed: COLONOSCOPY WITH PROPOFOL   Patient location during evaluation: PACU Anesthesia Type: General Level of consciousness: awake and alert, oriented and patient cooperative Pain management: pain level controlled Vital Signs Assessment: post-procedure vital signs reviewed and stable Respiratory status: spontaneous breathing, nonlabored ventilation and respiratory function stable Cardiovascular status: blood pressure returned to baseline and stable Postop Assessment: adequate PO intake Anesthetic complications: no   No notable events documented.   Last Vitals:  Vitals:   06/30/23 0823 06/30/23 0833  BP: 112/67 (!) 125/59  Pulse: 66 60  Resp: 13 16  Temp:    SpO2: 99% 100%    Last Pain:  Vitals:   06/30/23 0833  TempSrc:   PainSc: 0-No pain                 Dorothey Gate

## 2023-06-30 NOTE — Anesthesia Preprocedure Evaluation (Addendum)
 Anesthesia Evaluation  Patient identified by MRN, date of birth, ID band Patient awake    Reviewed: Allergy & Precautions, NPO status , Patient's Chart, lab work & pertinent test results  History of Anesthesia Complications (+) PONV and history of anesthetic complications  Airway Mallampati: I   Neck ROM: Full    Dental  (+) Caps   Pulmonary neg pulmonary ROS   Pulmonary exam normal breath sounds clear to auscultation       Cardiovascular hypertension, Normal cardiovascular exam Rhythm:Regular Rate:Normal     Neuro/Psych negative neurological ROS     GI/Hepatic hiatal hernia,GERD  ,,Ulcerative colitis   Endo/Other  Obesity; prediabetes  Renal/GU negative Renal ROS     Musculoskeletal  (+) Arthritis ,    Abdominal   Peds  Hematology negative hematology ROS (+)   Anesthesia Other Findings   Reproductive/Obstetrics                              Anesthesia Physical Anesthesia Plan  ASA: 2  Anesthesia Plan: General   Post-op Pain Management:    Induction: Intravenous  PONV Risk Score and Plan: 3 and Propofol  infusion, TIVA and Treatment may vary due to age or medical condition  Airway Management Planned: Natural Airway  Additional Equipment:   Intra-op Plan:   Post-operative Plan:   Informed Consent: I have reviewed the patients History and Physical, chart, labs and discussed the procedure including the risks, benefits and alternatives for the proposed anesthesia with the patient or authorized representative who has indicated his/her understanding and acceptance.       Plan Discussed with: CRNA  Anesthesia Plan Comments: (LMA/GETA backup discussed.  Patient consented for risks of anesthesia including but not limited to:  - adverse reactions to medications - damage to eyes, teeth, lips or other oral mucosa - nerve damage due to positioning  - sore throat or  hoarseness - damage to heart, brain, nerves, lungs, other parts of body or loss of life  Informed patient about role of CRNA in peri- and intra-operative care.  Patient voiced understanding.)         Anesthesia Quick Evaluation

## 2023-06-30 NOTE — H&P (Signed)
 Outpatient short stay form Pre-procedure 06/30/2023  Shane Darling, MD  Primary Physician: Delmus Ferri, MD  Reason for visit:  UC  History of present illness:    73 y/o lady with history of UC and arthritis here for colonoscopy. Last colonoscopy 3 years ago was unremarkable. Sister had colon cancer in her 78's. No blood thinners. History of cholecystectomy.    Current Facility-Administered Medications:    0.9 %  sodium chloride  infusion, , Intravenous, Continuous, Thoma Paulsen, Leanora Prophet, MD, Last Rate: 20 mL/hr at 06/30/23 0724, New Bag at 06/30/23 0724  Medications Prior to Admission  Medication Sig Dispense Refill Last Dose/Taking   Cholecalciferol  (VITAMIN D) 50 MCG (2000 UT) tablet Take 2,000 Units by mouth daily.   Past Week   cyanocobalamin  1000 MCG tablet Take 1,000 mcg by mouth daily.   Past Week   losartan  (COZAAR ) 25 MG tablet Take 25 mg by mouth daily.   06/29/2023   pantoprazole  (PROTONIX ) 20 MG tablet Take 20 mg by mouth daily.   06/29/2023   rosuvastatin  (CRESTOR ) 5 MG tablet Take 5 mg by mouth at bedtime.   06/29/2023   acetaminophen  (TYLENOL ) 500 MG tablet Take 500 mg by mouth every 4 (four) hours as needed for moderate pain.      aspirin  EC 81 MG tablet Take 1 tablet (81 mg total) by mouth daily. Swallow whole. 30 tablet 12    docusate sodium  (COLACE) 100 MG capsule Take 1 capsule (100 mg total) by mouth 2 (two) times daily. 30 capsule 0    hydrocortisone  2.5 % cream Apply to rash under breasts 1-2 times daily as needed for itch. 28 g 3    HYDROmorphone  (DILAUDID ) 2 MG tablet Take 1 tablet (2 mg total) by mouth every 4 (four) hours as needed for moderate pain. 30 tablet 0    ketoconazole  (NIZORAL ) 2 % cream Apply to rash under breasts 1-2 times daily as needed for rash. 60 g 3    Mesalamine  800 MG TBEC Take 800 mg by mouth daily.      methocarbamol  (ROBAXIN ) 500 MG tablet Take 1 tablet (500 mg total) by mouth every 8 (eight) hours as needed for muscle spasms. 60  tablet 1    metroNIDAZOLE  (METROCREAM ) 0.75 % cream Apply to nose and cheeks once to twice daily for rosacea. 45 g 3    ondansetron  (ZOFRAN ) 4 MG tablet Take 1 tablet (4 mg total) by mouth every 6 (six) hours as needed for nausea or vomiting. 40 tablet 0    triamcinolone  cream (KENALOG ) 0.1 % Apply to itchy rash on back 1-2 times daily until improved. Avoid face, groin, underarms. (Patient not taking: Reported on 12/13/2021) 80 g 1      Allergies  Allergen Reactions   Calcium      Due to UC   Codeine Nausea Only    dizzy   Latex Hives   Nsaids Other (See Comments)    Aggravate ulcerative colitis   Sulfa Antibiotics Other (See Comments)    Unknown   Tramadol  Nausea Only     Past Medical History:  Diagnosis Date   Actinic keratosis    Adiposity    Adrenal adenoma    Arthritis    Basal cell carcinoma 11/16/2018   Right lat. mid back. Superficial and nodular patterns. EDC 12/14/2018   Chronic heartburn    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HCC)    Family history of adverse reaction to anesthesia    mother hallucinated   Gastritis  GERD (gastroesophageal reflux disease)    Headache disorder    History of hiatal hernia    Hyperlipidemia    Hypertension    Osteopenia    Ulcerative colitis (HCC)     Review of systems:  Otherwise negative.    Physical Exam  Gen: Alert, oriented. Appears stated age.  HEENT: PERRLA. Lungs: No respiratory distress CV: RRR Abd: soft, benign, no masses Ext: No edema    Planned procedures: Proceed with colonoscopy. The patient understands the nature of the planned procedure, indications, risks, alternatives and potential complications including but not limited to bleeding, infection, perforation, damage to internal organs and possible oversedation/side effects from anesthesia. The patient agrees and gives consent to proceed.  Please refer to procedure notes for findings, recommendations and patient disposition/instructions.     Shane Darling,  MD [facility]

## 2023-06-30 NOTE — Interval H&P Note (Signed)
 History and Physical Interval Note:  06/30/2023 7:46 AM  Jamie Reid  has presented today for surgery, with the diagnosis of Ulcerative Colitis.  The various methods of treatment have been discussed with the patient and family. After consideration of risks, benefits and other options for treatment, the patient has consented to  Procedure(s): COLONOSCOPY WITH PROPOFOL  (N/A) as a surgical intervention.  The patient's history has been reviewed, patient examined, no change in status, stable for surgery.  I have reviewed the patient's chart and labs.  Questions were answered to the patient's satisfaction.     Shane Darling  Ok to proceed with colonoscopy

## 2023-07-01 LAB — SURGICAL PATHOLOGY

## 2023-07-08 ENCOUNTER — Ambulatory Visit: Payer: Medicare HMO | Admitting: Dermatology

## 2023-07-08 DIAGNOSIS — D229 Melanocytic nevi, unspecified: Secondary | ICD-10-CM

## 2023-07-08 DIAGNOSIS — L219 Seborrheic dermatitis, unspecified: Secondary | ICD-10-CM

## 2023-07-08 DIAGNOSIS — L570 Actinic keratosis: Secondary | ICD-10-CM

## 2023-07-08 DIAGNOSIS — L719 Rosacea, unspecified: Secondary | ICD-10-CM

## 2023-07-08 DIAGNOSIS — L817 Pigmented purpuric dermatosis: Secondary | ICD-10-CM

## 2023-07-08 DIAGNOSIS — L814 Other melanin hyperpigmentation: Secondary | ICD-10-CM | POA: Diagnosis not present

## 2023-07-08 DIAGNOSIS — L578 Other skin changes due to chronic exposure to nonionizing radiation: Secondary | ICD-10-CM | POA: Diagnosis not present

## 2023-07-08 DIAGNOSIS — D1801 Hemangioma of skin and subcutaneous tissue: Secondary | ICD-10-CM

## 2023-07-08 DIAGNOSIS — W908XXA Exposure to other nonionizing radiation, initial encounter: Secondary | ICD-10-CM

## 2023-07-08 DIAGNOSIS — Z1283 Encounter for screening for malignant neoplasm of skin: Secondary | ICD-10-CM

## 2023-07-08 DIAGNOSIS — D2271 Melanocytic nevi of right lower limb, including hip: Secondary | ICD-10-CM

## 2023-07-08 DIAGNOSIS — Z85828 Personal history of other malignant neoplasm of skin: Secondary | ICD-10-CM

## 2023-07-08 DIAGNOSIS — L821 Other seborrheic keratosis: Secondary | ICD-10-CM

## 2023-07-08 NOTE — Progress Notes (Signed)
 Follow-Up Visit   Subjective  Jamie Reid is a 73 y.o. female who presents for the following: Skin Cancer Screening and Full Body Skin Exam  The patient presents for Total-Body Skin Exam (TBSE) for skin cancer screening and mole check. The patient has spots, moles and lesions to be evaluated, some may be new or changing. She has a history of BCC of the right lateral mid back, 2020.   The following portions of the chart were reviewed this encounter and updated as appropriate: medications, allergies, medical history  Review of Systems:  No other skin or systemic complaints except as noted in HPI or Assessment and Plan.  Objective  Well appearing patient in no apparent distress; mood and affect are within normal limits.  A full examination was performed including scalp, head, eyes, ears, nose, lips, neck, chest, axillae, abdomen, back, buttocks, bilateral upper extremities, bilateral lower extremities, hands, feet, fingers, toes, fingernails, and toenails. All findings within normal limits unless otherwise noted below.   Relevant physical exam findings are noted in the Assessment and Plan.  spinal upper back x 3, chest x 2, R forearm x 1, R wrist x 1, L hand x 2, L wrist x 1, L lat cheek x 1 (11) Pink scaly macules.  L post ankle x 1, R lower calf x 1, L lower pretibia x 1 (3) Keratotic papules.  Assessment & Plan   SKIN CANCER SCREENING PERFORMED TODAY.  ACTINIC DAMAGE - Chronic condition, secondary to cumulative UV/sun exposure - diffuse scaly erythematous macules with underlying dyspigmentation - Recommend daily broad spectrum sunscreen SPF 30+ to sun-exposed areas, reapply every 2 hours as needed.  - Staying in the shade or wearing long sleeves, sun glasses (UVA+UVB protection) and wide brim hats (4-inch brim around the entire circumference of the hat) are also recommended for sun protection.  - Call for new or changing lesions.  LENTIGINES, SEBORRHEIC KERATOSES, HEMANGIOMAS  - Benign normal skin lesions - Benign-appearing - Call for any changes  MELANOCYTIC NEVI - Tan-brown and/or pink-flesh-colored symmetric macules and papules - R calf 2.39mm brown macule darker edge, stable  - Benign appearing on exam today - Observation - Call clinic for new or changing moles - Recommend daily use of broad spectrum spf 30+ sunscreen to sun-exposed areas.   HISTORY OF BASAL CELL CARCINOMA OF THE SKIN - No evidence of recurrence today of the right lateral mid back - Recommend regular full body skin exams - Recommend daily broad spectrum sunscreen SPF 30+ to sun-exposed areas, reapply every 2 hours as needed.  - Call if any new or changing lesions are noted between office visits  SCHAMBERG'S PIGMENTED PURPURA Exam: Tiny rust-colored macules on the lower legs  Benign, observe.  Recommend daily compression stockings  SEBORRHEIC DERMATITIS Exam: Clear today   Chronic condition with duration or expected duration over one year. Currently well-controlled.    Seborrheic Dermatitis is a chronic persistent rash characterized by pinkness and scaling most commonly of the mid face but also can occur on the scalp (dandruff), ears; mid chest, mid back and groin.  It tends to be exacerbated by stress and cooler weather.  People who have neurologic disease may experience new onset or exacerbation of existing seborrheic dermatitis.  The condition is not curable but treatable and can be controlled.   Treatment Plan: Continue Ketoconazole  2% Cream QD/BID prn.   ROSACEA Exam Mid face erythema with telangiectasias   Chronic condition with duration or expected duration over one year. Currently well-controlled.  Rosacea is a chronic progressive skin condition usually affecting the face of adults, causing redness and/or acne bumps. It is treatable but not curable. It sometimes affects the eyes (ocular rosacea) as well. It may respond to topical and/or systemic medication and can flare  with stress, sun exposure, alcohol, exercise, topical steroids (including hydrocortisone /cortisone 10) and some foods.  Daily application of broad spectrum spf 30+ sunscreen to face is recommended to reduce flares.   Treatment Plan Continue metronidazole  0.75% cream prn flares.   AK (ACTINIC KERATOSIS) (11) spinal upper back x 3, chest x 2, R forearm x 1, R wrist x 1, L hand x 2, L wrist x 1, L lat cheek x 1 (11) Actinic keratoses are precancerous spots that appear secondary to cumulative UV radiation exposure/sun exposure over time. They are chronic with expected duration over 1 year. A portion of actinic keratoses will progress to squamous cell carcinoma of the skin. It is not possible to reliably predict which spots will progress to skin cancer and so treatment is recommended to prevent development of skin cancer.  Recommend daily broad spectrum sunscreen SPF 30+ to sun-exposed areas, reapply every 2 hours as needed.  Recommend staying in the shade or wearing long sleeves, sun glasses (UVA+UVB protection) and wide brim hats (4-inch brim around the entire circumference of the hat). Call for new or changing lesions. Destruction of lesion - spinal upper back x 3, chest x 2, R forearm x 1, R wrist x 1, L hand x 2, L wrist x 1, L lat cheek x 1 (11)  Destruction method: cryotherapy   Informed consent: discussed and consent obtained   Lesion destroyed using liquid nitrogen: Yes   Region frozen until ice ball extended beyond lesion: Yes   Outcome: patient tolerated procedure well with no complications   Post-procedure details: wound care instructions given   Additional details:  Prior to procedure, discussed risks of blister formation, small wound, skin dyspigmentation, or rare scar following cryotherapy. Recommend Vaseline ointment to treated areas while healing.  HYPERTROPHIC ACTINIC KERATOSIS (3) L post ankle x 1, R lower calf x 1, L lower pretibia x 1 (3) Vs ISKs  Actinic keratoses are  precancerous spots that appear secondary to cumulative UV radiation exposure/sun exposure over time. They are chronic with expected duration over 1 year. A portion of actinic keratoses will progress to squamous cell carcinoma of the skin. It is not possible to reliably predict which spots will progress to skin cancer and so treatment is recommended to prevent development of skin cancer.  Recommend daily broad spectrum sunscreen SPF 30+ to sun-exposed areas, reapply every 2 hours as needed.  Recommend staying in the shade or wearing long sleeves, sun glasses (UVA+UVB protection) and wide brim hats (4-inch brim around the entire circumference of the hat). Call for new or changing lesions. Destruction of lesion - L post ankle x 1, R lower calf x 1, L lower pretibia x 1 (3)  Destruction method: cryotherapy   Informed consent: discussed and consent obtained   Lesion destroyed using liquid nitrogen: Yes   Region frozen until ice ball extended beyond lesion: Yes   Outcome: patient tolerated procedure well with no complications   Post-procedure details: wound care instructions given   Additional details:  Prior to procedure, discussed risks of blister formation, small wound, skin dyspigmentation, or rare scar following cryotherapy. Recommend Vaseline ointment to treated areas while healing.  Return in about 6 months (around 01/08/2024) for Hx AKs.  Lee Public  Harles Lied, CMA, am acting as scribe for Artemio Larry, MD .   Documentation: I have reviewed the above documentation for accuracy and completeness, and I agree with the above.  Artemio Larry, MD

## 2023-07-08 NOTE — Patient Instructions (Addendum)

## 2023-09-02 ENCOUNTER — Ambulatory Visit
Admission: RE | Admit: 2023-09-02 | Discharge: 2023-09-02 | Disposition: A | Discharge status: Home / Self Care | Source: Ambulatory Visit | Attending: Physician Assistant | Admitting: Physician Assistant

## 2023-09-02 DIAGNOSIS — Z1231 Encounter for screening mammogram for malignant neoplasm of breast: Secondary | ICD-10-CM | POA: Diagnosis present

## 2024-01-12 ENCOUNTER — Ambulatory Visit: Admitting: Dermatology

## 2024-01-12 DIAGNOSIS — L578 Other skin changes due to chronic exposure to nonionizing radiation: Secondary | ICD-10-CM | POA: Diagnosis not present

## 2024-01-12 DIAGNOSIS — Z872 Personal history of diseases of the skin and subcutaneous tissue: Secondary | ICD-10-CM

## 2024-01-12 DIAGNOSIS — L814 Other melanin hyperpigmentation: Secondary | ICD-10-CM

## 2024-01-12 DIAGNOSIS — L821 Other seborrheic keratosis: Secondary | ICD-10-CM | POA: Diagnosis not present

## 2024-01-12 DIAGNOSIS — L570 Actinic keratosis: Secondary | ICD-10-CM

## 2024-01-12 DIAGNOSIS — Z7189 Other specified counseling: Secondary | ICD-10-CM

## 2024-01-12 DIAGNOSIS — W908XXA Exposure to other nonionizing radiation, initial encounter: Secondary | ICD-10-CM | POA: Diagnosis not present

## 2024-01-12 NOTE — Progress Notes (Signed)
 Follow-Up Visit   Subjective  Jamie Reid is a 73 y.o. female who presents for the following: 6 month ak follow up   Discussed Red Light / Blue light PDT treatment  but patient defers treatment   The patient has spots, moles and lesions to be evaluated, some may be new or changing and the patient may have concern these could be cancer.   The following portions of the chart were reviewed this encounter and updated as appropriate: medications, allergies, medical history  Review of Systems:  No other skin or systemic complaints except as noted in HPI or Assessment and Plan.  Objective  Well appearing patient in no apparent distress; mood and affect are within normal limits.  A focused examination was performed of the following areas: B/l hands, arms, chest, back, face, right lower calf, left posterior ankle, left lower pretibia  Relevant exam findings are noted in the Assessment and Plan.  left lateral cheek x 1, right zygoma x 1, left hand dorsum x 3, right upper pretibia x 2, left pretibia x 1 (8) Erythematous thin papules/macules with gritty scale.   Assessment & Plan    ACTINIC DAMAGE WITH PRECANCEROUS ACTINIC KERATOSES Counseling for Topical Chemotherapy Management: Patient exhibits: - Severe, confluent actinic changes with pre-cancerous actinic keratoses that is secondary to cumulative UV radiation exposure over time - Condition that is severe; chronic, not at goal. - diffuse scaly erythematous macules and papules with underlying dyspigmentation - Discussed Prescription Field Treatment topical Chemotherapy for Severe, Chronic Confluent Actinic Changes with Pre-Cancerous Actinic Keratoses Field treatment involves treatment of an entire area of skin that has confluent Actinic Changes (Sun/ Ultraviolet light damage) and PreCancerous Actinic Keratoses by method of PhotoDynamic Therapy (PDT) and/or prescription Topical Chemotherapy agents such as 5-fluorouracil,  5-fluorouracil/calcipotriene, and/or imiquimod.  The purpose is to decrease the number of clinically evident and subclinical PreCancerous lesions to prevent progression to development of skin cancer by chemically destroying early precancer changes that may or may not be visible.  It has been shown to reduce the risk of developing skin cancer in the treated area. As a result of treatment, redness, scaling, crusting, and open sores may occur during treatment course. One or more than one of these methods may be used and may have to be used several times to control, suppress and eliminate the PreCancerous changes. Discussed treatment course, expected reaction, and possible side effects. - Recommend daily broad spectrum sunscreen SPF 30+ to sun-exposed areas, reapply every 2 hours as needed.  - Staying in the shade or wearing long sleeves, sun glasses (UVA+UVB protection) and wide brim hats (4-inch brim around the entire circumference of the hat) are also recommended. - Call for new or changing lesions.  Discussed - Red light photodynamic therapy with debridement to the face - patient deferred treatment.  Info given, pt may reconsider in the future   LENTIGINES Exam: scattered tan macules face, arms, legs Due to sun exposure Treatment Plan: Benign-appearing, observe. Recommend daily broad spectrum sunscreen SPF 30+ to sun-exposed areas, reapply every 2 hours as needed.  Call for any changes   SEBORRHEIC KERATOSIS - Stuck-on, waxy, tan-brown papules face, arms. legs - Benign-appearing - Discussed benign etiology and prognosis. - Observe - Call for any changes   HISTORY OF PRECANCEROUS ACTINIC KERATOSIS Left lower ankle  Right lower calf  Chest  - site(s) of PreCancerous Actinic Keratosis clear today. - these may recur and new lesions may form requiring treatment to prevent transformation into skin cancer -  observe for new or changing spots and contact [facility] for appointment if  occur - photoprotection with sun protective clothing; sunglasses and broad spectrum sunscreen with SPF of at least 30 + and frequent self skin exams recommended - yearly exams by a dermatologist recommended for persons with history of PreCancerous Actinic Keratoses  ACTINIC KERATOSIS (8) left lateral cheek x 1, right zygoma x 1, left hand dorsum x 3, right upper pretibia x 2, left pretibia x 1 (8) Hypertrophic Ak vs Isk -Right upper pretibial x2, Left pretibia x 1   Actinic keratoses are precancerous spots that appear secondary to cumulative UV radiation exposure/sun exposure over time. They are chronic with expected duration over 1 year. A portion of actinic keratoses will progress to squamous cell carcinoma of the skin. It is not possible to reliably predict which spots will progress to skin cancer and so treatment is recommended to prevent development of skin cancer.  Recommend daily broad spectrum sunscreen SPF 30+ to sun-exposed areas, reapply every 2 hours as needed.  Recommend staying in the shade or wearing long sleeves, sun glasses (UVA+UVB protection) and wide brim hats (4-inch brim around the entire circumference of the hat). Call for new or changing lesions. Destruction of lesion - left lateral cheek x 1, right zygoma x 1, left hand dorsum x 3, right upper pretibia x 2, left pretibia x 1 (8)  Destruction method: cryotherapy   Informed consent: discussed and consent obtained   Lesion destroyed using liquid nitrogen: Yes   Region frozen until ice ball extended beyond lesion: Yes   Outcome: patient tolerated procedure well with no complications   Post-procedure details: wound care instructions given   Additional details:  Prior to procedure, discussed risks of blister formation, small wound, skin dyspigmentation, or rare scar following cryotherapy. Recommend Vaseline ointment to treated areas while healing.    Return for May 2026 tbse hx of bcc.  I, Eleanor Blush, CMA, am acting  as scribe for Rexene Rattler, MD.   Documentation: I have reviewed the above documentation for accuracy and completeness, and I agree with the above.  Rexene Rattler, MD

## 2024-01-12 NOTE — Patient Instructions (Addendum)
 Cryotherapy Aftercare  Wash gently with soap and water  everyday.   Apply Vaseline and Band-Aid daily until healed.    Actinic keratoses are precancerous spots that appear secondary to cumulative UV radiation exposure/sun exposure over time. They are chronic with expected duration over 1 year. A portion of actinic keratoses will progress to squamous cell carcinoma of the skin. It is not possible to reliably predict which spots will progress to skin cancer and so treatment is recommended to prevent development of skin cancer.  Recommend daily broad spectrum sunscreen SPF 30+ to sun-exposed areas, reapply every 2 hours as needed.  Recommend staying in the shade or wearing long sleeves, sun glasses (UVA+UVB protection) and wide brim hats (4-inch brim around the entire circumference of the hat). Call for new or changing lesions.     Due to recent changes in healthcare laws, you may see results of your pathology and/or laboratory studies on MyChart before the doctors have had a chance to review them. We understand that in some cases there may be results that are confusing or concerning to you. Please understand that not all results are received at the same time and often the doctors may need to interpret multiple results in order to provide you with the best plan of care or course of treatment. Therefore, we ask that you please give us  2 business days to thoroughly review all your results before contacting the office for clarification. Should we see a critical lab result, you will be contacted sooner.   If You Need Anything After Your Visit  If you have any questions or concerns for your doctor, please call our main line at [phone] and press option 4 to reach your doctor's medical assistant. If no one answers, please leave a voicemail as directed and we will return your call as soon as possible. Messages left after 4 pm will be answered the following business day.   You may also send us   a message via MyChart. We typically respond to MyChart messages within 1-2 business days.  For prescription refills, please ask your pharmacy to contact our office. Our fax number is [phone].  If you have an urgent issue when the clinic is closed that cannot wait until the next business day, you can page your doctor at the number below.    Please note that while we do our best to be available for urgent issues outside of office hours, we are not available 24/7.   If you have an urgent issue and are unable to reach us , you may choose to seek medical care at your doctor's office, retail clinic, urgent care center, or emergency room.  If you have a medical emergency, please immediately call 911 or go to the emergency department.  Pager Numbers  - Dr. Hester: [phone]  - Dr. Jackquline: [phone]  - Dr. Claudene: [phone]   - Dr. Raymund: [phone]  In the event of inclement weather, please call our main line at [phone] for an update on the status of any delays or closures.  Dermatology Medication Tips: Please keep the boxes that topical medications come in in order to help keep track of the instructions about where and how to use these. Pharmacies typically print the medication instructions only on the boxes and not directly on the medication tubes.   If your medication is too expensive, please contact our office at [phone] option 4 or send us  a message through MyChart.   We are unable to tell  what your co-pay for medications will be in advance as this is different depending on your insurance coverage. However, we may be able to find a substitute medication at lower cost or fill out paperwork to get insurance to cover a needed medication.   If a prior authorization is required to get your medication covered by your insurance company, please allow us  1-2 business days to complete this process.  Drug prices often vary depending on where the prescription is filled  and some pharmacies may offer cheaper prices.  The website www.goodrx.com contains coupons for medications through different pharmacies. The prices here do not account for what the cost may be with help from insurance (it may be cheaper with your insurance), but the website can give you the price if you did not use any insurance.  - You can print the associated coupon and take it with your prescription to the pharmacy.  - You may also stop by our office during regular business hours and pick up a GoodRx coupon card.  - If you need your prescription sent electronically to a different pharmacy, notify our office through [facility] or by phone at [phone] option 4.     Si Usted Necesita Algo Despus de Su Visita  Tambin puede enviarnos un mensaje a travs de Clinical Cytogeneticist. Por lo general respondemos a los mensajes de MyChart en el transcurso de 1 a 2 das hbiles.  Para renovar recetas, por favor pida a su farmacia que se ponga en contacto con nuestra oficina. Randi lakes de fax es [place] [phone].  Si tiene un asunto urgente cuando la clnica est cerrada y que no puede esperar hasta el siguiente da hbil, puede llamar/localizar a su doctor(a) al nmero que aparece a continuacin.   Por favor, tenga en cuenta que aunque hacemos todo lo posible para estar disponibles para asuntos urgentes fuera del horario de [place], no estamos disponibles las 24 horas del da, los 7 [address] de la [place].   Si tiene un problema urgente y no puede comunicarse con nosotros, puede optar por buscar atencin mdica  en el consultorio de su doctor(a), en una clnica privada, en un centro de atencin urgente o en una sala de emergencias.  Si tiene engineer, drilling, por favor llame inmediatamente al 911 o vaya a la sala de emergencias.  Nmeros de bper  - Dr. Hester: [phone]  - Dra. Jackquline: [phone]  - Dr. Claudene: [phone]  - Dra. Kitts: [phone]  En caso de inclemencias del  [place], por favor llame a nuestra lnea principal al [phone] para una actualizacin sobre el estado de cualquier retraso o cierre.  Consejos para la medicacin en dermatologa: Por favor, guarde las cajas en las que vienen los medicamentos de uso tpico para ayudarle a seguir las instrucciones sobre dnde y cmo usarlos. Las farmacias generalmente imprimen las instrucciones del medicamento slo en las cajas y no directamente en los tubos del [place].   Si su medicamento es muy caro, por favor, pngase en contacto con landry rieger llamando al [phone] y presione la opcin 4 o envenos un mensaje a travs de Clinical Cytogeneticist.   No podemos decirle cul ser su copago por los medicamentos por adelantado ya que esto es diferente dependiendo de la cobertura de su seguro. Sin embargo, es posible que podamos encontrar un medicamento sustituto a audiological scientist un formulario para que el seguro cubra el medicamento que se considera necesario.   Si se requiere una autorizacin previa para que su  compaa de seguros cubra su medicamento, por favor permtanos de 1 a 2 das hbiles para completar este proceso.  Los precios de los medicamentos varan con frecuencia dependiendo del environmental consultant de dnde se surte la receta y alguna farmacias pueden ofrecer precios ms baratos.  El sitio web www.goodrx.com tiene cupones para medicamentos de health and safety inspector. Los precios aqu no tienen en cuenta lo que podra costar con la ayuda del seguro (puede ser ms barato con su seguro), pero el sitio web puede darle el precio si no utiliz tourist information centre manager.  - Puede imprimir el cupn correspondiente y llevarlo con su receta a la farmacia.  - Tambin puede pasar por nuestra oficina durante el horario de atencin regular y education officer, museum una tarjeta de cupones de GoodRx.  - Si necesita que su receta se enve electrnicamente a una farmacia diferente, informe a nuestra oficina a travs de MyChart de [place] o por telfono  llamando al [phone] y presione la opcin 4.

## 2024-07-13 ENCOUNTER — Ambulatory Visit: Admitting: Dermatology
